# Patient Record
Sex: Female | Born: 1950 | Race: White | Hispanic: No | Marital: Married | Smoking: Current every day smoker
Health system: Southern US, Community
[De-identification: ages and names within clinical notes are randomized; demographics above are authoritative.]

## PROBLEM LIST (undated history)

## (undated) DIAGNOSIS — M25551 Pain in right hip: Secondary | ICD-10-CM

## (undated) DIAGNOSIS — E785 Hyperlipidemia, unspecified: Secondary | ICD-10-CM

## (undated) DIAGNOSIS — M8580 Other specified disorders of bone density and structure, unspecified site: Secondary | ICD-10-CM

## (undated) DIAGNOSIS — T8859XA Other complications of anesthesia, initial encounter: Secondary | ICD-10-CM

## (undated) DIAGNOSIS — M1990 Unspecified osteoarthritis, unspecified site: Secondary | ICD-10-CM

## (undated) DIAGNOSIS — F419 Anxiety disorder, unspecified: Secondary | ICD-10-CM

## (undated) DIAGNOSIS — Z973 Presence of spectacles and contact lenses: Secondary | ICD-10-CM

## (undated) DIAGNOSIS — R7303 Prediabetes: Secondary | ICD-10-CM

## (undated) DIAGNOSIS — G4730 Sleep apnea, unspecified: Secondary | ICD-10-CM

## (undated) DIAGNOSIS — K219 Gastro-esophageal reflux disease without esophagitis: Secondary | ICD-10-CM

## (undated) DIAGNOSIS — N189 Chronic kidney disease, unspecified: Secondary | ICD-10-CM

## (undated) HISTORY — PX: COLONOSCOPY: SHX174

## (undated) HISTORY — PX: HIP SURGERY: SHX245

## (undated) HISTORY — PX: LIPOSUCTION: SHX10

## (undated) HISTORY — PX: BREAST ENHANCEMENT SURGERY: SHX7

## (undated) HISTORY — PX: WISDOM TOOTH EXTRACTION: SHX21

## (undated) HISTORY — DX: Pain in right hip: M25.551

## (undated) HISTORY — PX: OTHER SURGICAL HISTORY: SHX169

## (undated) HISTORY — PX: TUBAL LIGATION: SHX77

## (undated) HISTORY — PX: SQUAMOUS CELL CARCINOMA EXCISION: SHX2433

## (undated) HISTORY — DX: Anxiety disorder, unspecified: F41.9

## (undated) HISTORY — DX: Unspecified osteoarthritis, unspecified site: M19.90

## (undated) HISTORY — DX: Other specified disorders of bone density and structure, unspecified site: M85.80

## (undated) HISTORY — DX: Sleep apnea, unspecified: G47.30

---

## 1999-01-16 ENCOUNTER — Other Ambulatory Visit: Admission: RE | Admit: 1999-01-16 | Discharge: 1999-01-16 | Payer: Self-pay | Admitting: *Deleted

## 2000-02-17 ENCOUNTER — Other Ambulatory Visit: Admission: RE | Admit: 2000-02-17 | Discharge: 2000-02-17 | Payer: Self-pay | Admitting: *Deleted

## 2001-02-23 ENCOUNTER — Other Ambulatory Visit: Admission: RE | Admit: 2001-02-23 | Discharge: 2001-02-23 | Payer: Self-pay | Admitting: *Deleted

## 2002-02-15 ENCOUNTER — Other Ambulatory Visit: Admission: RE | Admit: 2002-02-15 | Discharge: 2002-02-15 | Payer: Self-pay | Admitting: *Deleted

## 2003-02-19 ENCOUNTER — Other Ambulatory Visit: Admission: RE | Admit: 2003-02-19 | Discharge: 2003-02-19 | Payer: Self-pay | Admitting: *Deleted

## 2004-03-20 ENCOUNTER — Other Ambulatory Visit: Admission: RE | Admit: 2004-03-20 | Discharge: 2004-03-20 | Payer: Self-pay | Admitting: *Deleted

## 2005-10-14 ENCOUNTER — Other Ambulatory Visit: Admission: RE | Admit: 2005-10-14 | Discharge: 2005-10-14 | Payer: Self-pay | Admitting: Obstetrics and Gynecology

## 2006-03-30 ENCOUNTER — Ambulatory Visit: Payer: Self-pay | Admitting: Cardiology

## 2006-05-25 ENCOUNTER — Ambulatory Visit: Payer: Self-pay | Admitting: Cardiology

## 2006-09-03 ENCOUNTER — Encounter: Admission: RE | Admit: 2006-09-03 | Discharge: 2006-09-03 | Payer: Self-pay | Admitting: Orthopedic Surgery

## 2007-07-18 HISTORY — PX: OTHER SURGICAL HISTORY: SHX169

## 2008-03-15 ENCOUNTER — Ambulatory Visit: Payer: Self-pay | Admitting: Cardiology

## 2008-05-08 ENCOUNTER — Ambulatory Visit: Payer: Self-pay | Admitting: Cardiology

## 2008-05-08 LAB — CONVERTED CEMR LAB
ALT: 40 units/L — ABNORMAL HIGH (ref 0–35)
AST: 32 units/L (ref 0–37)
Bilirubin, Direct: 0.1 mg/dL (ref 0.0–0.3)
Cholesterol: 160 mg/dL (ref 0–200)
HDL: 29.6 mg/dL — ABNORMAL LOW (ref >39.0)
Total Protein: 6.3 g/dL (ref 6.0–8.3)
VLDL: 26 mg/dL (ref 0–40)

## 2008-07-30 ENCOUNTER — Ambulatory Visit: Payer: Self-pay | Admitting: Cardiology

## 2010-03-21 DIAGNOSIS — F17200 Nicotine dependence, unspecified, uncomplicated: Secondary | ICD-10-CM | POA: Insufficient documentation

## 2010-03-21 DIAGNOSIS — E669 Obesity, unspecified: Secondary | ICD-10-CM

## 2010-03-21 DIAGNOSIS — E785 Hyperlipidemia, unspecified: Secondary | ICD-10-CM | POA: Insufficient documentation

## 2010-04-08 ENCOUNTER — Ambulatory Visit: Payer: Self-pay | Admitting: Cardiology

## 2010-12-18 NOTE — Assessment & Plan Note (Signed)
Summary: f2y  Medications Added NASONEX 50 MCG/ACT SUSP (MOMETASONE FUROATE) one spray in each nostril SIMVASTATIN 40 MG TABS (SIMVASTATIN) 1 once daily FISH OIL 1000 MG CAPS (OMEGA-3 FATTY ACIDS) 1 two times a day CHANTIX STARTING MONTH PAK 0.5 MG X 11 & 1 MG X 42 TABS (VARENICLINE TARTRATE) AS DIRECTED PER PHARMACY SIMVASTATIN 40 MG TABS (SIMVASTATIN) 1 once daily        Visit Type:  Follow-up  CC:  no complaints.  History of Present Illness: Veronica Warren returns today for evaluation management of her mixed hyperlipidemia, tobacco use, and obesity.  She has been exercising on a regular basis in a boot camp. She denies any symptoms of angina or ischemia.  She does have some dependent edema at the end of the day despite taking HCTZ. She is not better with her diet including salt. She just got back from a cruise. She has gained weight.  She is also smoking a pack of cigarettes per day. She would like to quit.  She's also run out of her simvastatin. See compliance is a major issue.  Current Medications (verified): 1)  Hydrochlorothiazide 25 Mg Tabs (Hydrochlorothiazide) .Marland Kitchen.. 1 Tab Once Daily 2)  Aspirin 81 Mg Tbec (Aspirin) .... Take One Tablet By Mouth Daily 3)  Nasonex 50 Mcg/act Susp (Mometasone Furoate) .... One Spray in Each Nostril  Allergies: 1)  ! Valium 2)  ! Codeine  Past History:  Past Medical History: Last updated: 03/21/2010 HYPERLIPIDEMIA-MIXED (ICD-272.4) TOBACCO USER (ICD-305.1) OBESITY (ICD-278.00)    Past Surgical History: Last updated: 03/21/2010 Hip Arthroplasty-Total C-sections Lipo suction  Tummy tuck  Social History: Last updated: 03/21/2010 Married  Tobacco Use - Yes.  Alcohol Use - yes Regular Exercise - yes Drug Use - no  Risk Factors: Exercise: yes (03/21/2010)  Risk Factors: Smoking Status: current (03/21/2010)  Review of Systems       negative other than history of present illness  Vital Signs:  Patient profile:   60  year old female Height:      63 inches Weight:      182 pounds BMI:     32.36 Pulse rate:   80 / minute Pulse rhythm:   regular BP sitting:   122 / 82  (left arm)  Vitals Entered By: Jacquelin Hawking, CMA (Apr 08, 2010 10:11 AM)  Physical Exam  General:  obese.  obese.   Head:  normocephalic and atraumatic Eyes:  PERRLA/EOM intact; conjunctiva and lids normal. Mouth:  Teeth, gums and palate normal. Oral mucosa normal. Neck:  Neck supple, no JVD. No masses, thyromegaly or abnormal cervical nodes. Chest Tyshana Nishida:  no deformities or breast masses noted Lungs:  Clear bilaterally to auscultation and percussion. Heart:  Non-displaced PMI, chest non-tender; regular rate and rhythm, S1, S2 without murmurs, rubs or gallops. Carotid upstroke normal, no bruit. Normal abdominal aortic size, no bruits. Femorals normal pulses, no bruits. Pedals normal pulses. No edema, no varicosities. Msk:  Back normal, normal gait. Muscle strength and tone normal. Pulses:  pulses normal in all 4 extremities Extremities:  No clubbing or cyanosis. Neurologic:  Alert and oriented x 3. Skin:  Intact without lesions or rashes. Psych:  Normal affect.   Impression & Recommendations:  Problem # 1:  TOBACCO USER (ICD-305.1) Assessment Deteriorated She says she has smoked about a pack a day for while. She would like a prescription for Chantix. We have done that in the form of a starter pack going over indications and side effects.  Problem #  2:  HYPERLIPIDEMIA-MIXED (ICD-272.4)  She has run out of her simvastatin. We will restart simvastatin 40 mg every night and facial 1 g twice a day. Check followup blood work in 6-8 weeks. The following medications were removed from the medication list:    Simvastatin 40 Mg Tabs (Simvastatin) .Marland Kitchen... 1 tab at bedtime Her updated medication list for this problem includes:    Simvastatin 40 Mg Tabs (Simvastatin) .Marland Kitchen... 1 once daily  Orders: EKG w/ Interpretation (93000)  Problem # 3:   OBESITY (ICD-278.00) Assessment: Deteriorated  She is exercising 3 hours plus a week. Have encouraged her to continue to do so but she would not lose weight despite exercising. She will probably gained more weight when she stopped smoking. She is aware of this.  Orders: EKG w/ Interpretation (93000)  Patient Instructions: 1)  Your physician recommends that you schedule a follow-up appointment in: 6 MONTHS WITH DR Tremont Gavitt DUE NOV 2011 2)  Your physician recommends that you return for lab work UJ:[id number] LABS LIPID LIVER 272.4 V58.69 WEEK OF JULY 5 TH 3)  Your physician has recommended you make the following change in your medication: CHANTIX AS DIRECTED  4)  SIMVASTATIN 40 MG 1 once daily 5)  FISH OIL 1000 MG I two times a day  Prescriptions: SIMVASTATIN 40 MG TABS (SIMVASTATIN) 1 once daily  #90 x 3   Entered by:   Scherrie Bateman, LPN   Authorized by:   Gaylord Shih, MD, [facility]   Signed by:   Scherrie Bateman, LPN on 82/95/6213   Method used:   Print then Give to Patient   RxID:   [id number] CHANTIX STARTING MONTH PAK 0.5 MG X 11 & 1 MG X 42 TABS (VARENICLINE TARTRATE) AS DIRECTED PER PHARMACY  #60 x 3   Entered by:   Scherrie Bateman, LPN   Authorized by:   Gaylord Shih, MD, [facility]   Signed by:   Scherrie Bateman, LPN on 13/24/4010   Method used:   Print then Give to Patient   RxID:   [id number] FISH OIL 1000 MG CAPS (OMEGA-3 FATTY ACIDS) 1 two times a day  #60 x 0   Entered by:   Scherrie Bateman, LPN   Authorized by:   Gaylord Shih, MD, [facility]   Signed by:   Scherrie Bateman, LPN on 95/63/8756   Method used:   Electronically to        [facility] [phone]* (retail)       [address]       [place], [place]  [address]       Ph: [phone]       Fax: [phone]   RxID:   [id number] SIMVASTATIN 40 MG TABS (SIMVASTATIN) 1 once daily  #30 x 0   Entered by:   Scherrie Bateman, LPN   Authorized by:   Gaylord Shih, MD, [facility]   Signed by:   Scherrie Bateman, LPN on 83/15/1761   Method used:   Electronically to        [facility] [phone]* (retail)       [address]       [place], [place]  [address]       Ph: [phone]       Fax: [phone]   RxID:   [id number]

## 2011-03-31 NOTE — Assessment & Plan Note (Signed)
[facility]                            CARDIOLOGY OFFICE NOTE   NAME:Veronica Warren                    MRN:          [id number]  DATE:07/30/2008                            DOB:          1951-02-02    [name] returns today for management of her mixed hyperlipidemia,  sedentary lifestyle, obesity, and tobacco use.   She has lost couple of pounds.  She admits that she does not walk.  She  is under a lot of stress with her mother and father's health.  She  spends every other week down in [place], [place] to help them.   She is taking simvastatin 40 mg nightly in replacement for Vytorin.   Her numbers in June showed a total cholesterol of 160, triglycerides of  129, HDL of 29.6, and LDL of 105.  Her HDL did drop from 45 to this  value of 29.6.  I have asked her to add fish oil.  She is plagued and  bothered by the size of the capsules.  We will try to cut the dose.   She has had no chest pain or angina.   CURRENT MEDICATIONS:  1. Estratest one daily.  2. Wellbutrin 300 mg a day.  3. Prometrium 200 mg a day.  4. Simvastatin 40 mg a day.  5. Hydrochlorothiazide 25 mg a day.  6. Enteric-coated aspirin 81 mg a day.  7. Claritin 10 mg a day.   PHYSICAL EXAMINATION:  VITAL SIGNS:  Her blood pressure today is 114/74.  Pulse is 79 and regular.  Weight is 182.  HEENT:  Normal.  NECK:  Carotids are full without bruits.  Thyroid is not enlarged.  Trachea is midline.  LUNGS:  Clear to auscultation and percussion.  HEART:  Nondisplaced PMI, soft S1 and S2.  ABDOMEN:  Soft.  Good bowel sounds.  EXTREMITIES:  No cyanosis, clubbing, or edema.  Pulses are intact.  NEUROLOGIC:  Intact.   Ms. Hapke is unfortunately not taking the time to invest in her health.  We discussed at least walking 3 hours per week.  She says she will try  to do better.  She is taking her medications.  Her blood pressure is  under good control and her lipids are  satisfactory on simvastatin except  for the decrease in her HDL.   PLAN:  1. Continue current medication.  2. Walking 3 hours per week.  3. Maintain weight or try to lose.  4. Add fish oil 1000 mg daily.  5. Check labs in June 2010 and see me at that time.     Thomas C. Daleen Squibb, MD, [facility]  Electronically Signed    TCW/MedQ  DD: 07/30/2008  DT: 07/31/2008  Job #: [id number]   cc:   Guy Sandifer. Henderson Cloud, M.D.

## 2011-03-31 NOTE — Assessment & Plan Note (Signed)
[facility]                            CARDIOLOGY OFFICE NOTE   NAME:Janowiak, NEDA WILLENBRING                    MRN:          [id number]  DATE:03/15/2008                            DOB:          03-04-1951    Ms. Yum returns today for further management of her mixed  hyperlipidemia, tobacco use, sedentary lifestyle, obesity, and moderate  risk of premature coronary disease.   I last saw her Mar 30, 2006.   Since that time she has had a right hip replacement.  Somewhere in  there, she stopped her Vytorin and did not start it back.  She was even  thinking the Vytorin may have been responsible for hip problem.  I have  reassured her today that this was not the case.   We had her numbers well-controlled on Vytorin.   She is taking hydrochlorothiazide 25 mg every other day for some  peripheral edema.  Estratest one daily, Wellbutrin 300 mg a day,  Prometrium 2 mg a day.   She denies orthopnea, PND, chest discomfort, palpitations, presyncope,  syncope.   EXAM:  Blood pressure is 112/76, her pulse 78 and regular.  Weight is  184, which is up 11 pounds.  HEENT:  Normocephalic, atraumatic.  PERRL.  Extraocular is intact.  Sclerae are clear.  Face symmetry is normal.  Carotids are equal bilateral bruits, no JVD.  Thyroid is not enlarged.  Trachea is midline.  LUNGS:  Clear.  HEART:  Reveals a poorly appreciated PMI.  Soft S1-S2 without murmur or  gallop.  ABDOMEN:  Soft, good bowel sounds.  There is no cyanosis, clubbing or edema.  Pulses are intact.  NEURO:  Exam is intact.   Electrocardiogram is normal.   I had a long talk with Mrs. Gallacher today.  I have again told her she is  at moderate risk of having a premature coronary event and she has picked  up an additional risk factor of age since I last saw her.   RECOMMENDATIONS:  1. Simvastatin 40 mg p.o. nightly.  This is generic and will not cost      but $10 a month and she is willing to invest in  this.  2. Follow up labs in 6 weeks.  3. See me back in about 3 months for accountability.  4. Increase walking or exercising 3 hours a week.  5. Discontinue smoking.  6. Weight loss.     Thomas C. Daleen Squibb, MD, [facility]  Electronically Signed    TCW/MedQ  DD: 03/15/2008  DT: 03/15/2008  Job #: [id number]   cc:   Guy Sandifer. Henderson Cloud, M.D.

## 2012-08-01 ENCOUNTER — Ambulatory Visit (AMBULATORY_SURGERY_CENTER): Payer: PRIVATE HEALTH INSURANCE

## 2012-08-01 VITALS — Ht 63.0 in | Wt 194.8 lb

## 2012-08-01 DIAGNOSIS — Z1211 Encounter for screening for malignant neoplasm of colon: Secondary | ICD-10-CM

## 2012-08-01 DIAGNOSIS — Z860100 Personal history of colon polyps, unspecified: Secondary | ICD-10-CM

## 2012-08-01 MED ORDER — MOVIPREP 100 G PO SOLR: ORAL | Status: DC

## 2012-08-01 NOTE — Progress Notes (Signed)
Pt came into office for her pre-visit prior to her colonoscopy with Dr Coral Spikes on 08/10/12.She states she had a colonoscopy done 5 years ago with Dr Kinnie Scales. A medical release form was filled out and given to Sherri (CMA).

## 2012-08-02 ENCOUNTER — Encounter: Payer: Self-pay | Admitting: Gastroenterology

## 2012-08-10 ENCOUNTER — Ambulatory Visit (AMBULATORY_SURGERY_CENTER): Payer: PRIVATE HEALTH INSURANCE | Admitting: Gastroenterology

## 2012-08-10 ENCOUNTER — Encounter: Payer: Self-pay | Admitting: Gastroenterology

## 2012-08-10 VITALS — BP 119/66 | HR 80 | Temp 98.2°F | Resp 18 | Ht 63.0 in | Wt 194.0 lb

## 2012-08-10 DIAGNOSIS — Z860100 Personal history of colon polyps, unspecified: Secondary | ICD-10-CM

## 2012-08-10 DIAGNOSIS — D126 Benign neoplasm of colon, unspecified: Secondary | ICD-10-CM

## 2012-08-10 DIAGNOSIS — Z1211 Encounter for screening for malignant neoplasm of colon: Secondary | ICD-10-CM

## 2012-08-10 MED ORDER — SODIUM CHLORIDE 0.9 % IV SOLN: 500.0000 mL | INTRAVENOUS | Status: DC

## 2012-08-10 NOTE — Patient Instructions (Addendum)
Discharge instructions given with verbal understanding. Handout on polyp given. Resume previous medications. YOU HAD AN ENDOSCOPIC PROCEDURE TODAY AT THE [facility]: Refer to the procedure report that was given to you for any specific questions about what was found during the examination.  If the procedure report does not answer your questions, please call your gastroenterologist to clarify.  If you requested that your care partner not be given the details of your procedure findings, then the procedure report has been included in a sealed envelope for you to review at your convenience later.  YOU SHOULD EXPECT: Some feelings of bloating in the abdomen. Passage of more gas than usual.  Walking can help get rid of the air that was put into your GI tract during the procedure and reduce the bloating. If you had a lower endoscopy (such as a colonoscopy or flexible sigmoidoscopy) you may notice spotting of blood in your stool or on the toilet paper. If you underwent a bowel prep for your procedure, then you may not have a normal bowel movement for a few days.  DIET: Your first meal following the procedure should be a light meal and then it is ok to progress to your normal diet.  A half-sandwich or bowl of soup is an example of a good first meal.  Heavy or fried foods are harder to digest and may make you feel nauseous or bloated.  Likewise meals heavy in dairy and vegetables can cause extra gas to form and this can also increase the bloating.  Drink plenty of fluids but you should avoid alcoholic beverages for 24 hours.  ACTIVITY: Your care partner should take you home directly after the procedure.  You should plan to take it easy, moving slowly for the rest of the day.  You can resume normal activity the day after the procedure however you should NOT DRIVE or use heavy machinery for 24 hours (because of the sedation medicines used during the test).    SYMPTOMS TO REPORT IMMEDIATELY: A  gastroenterologist can be reached at any hour.  During normal business hours, 8:30 AM to 5:00 PM Monday through Friday, call [phone].  After hours and on weekends, please call the GI answering service at [phone] who will take a message and have the physician on call contact you.   Following lower endoscopy (colonoscopy or flexible sigmoidoscopy):  Excessive amounts of blood in the stool  Significant tenderness or worsening of abdominal pains  Swelling of the abdomen that is new, acute  Fever of 100F or higher   FOLLOW UP: If any biopsies were taken you will be contacted by phone or by letter within the next 1-3 weeks.  Call your gastroenterologist if you have not heard about the biopsies in 3 weeks.  Our staff will call the home number listed on your records the next business day following your procedure to check on you and address any questions or concerns that you may have at that time regarding the information given to you following your procedure. This is a courtesy call and so if there is no answer at the home number and we have not heard from you through the emergency physician on call, we will assume that you have returned to your regular daily activities without incident.  SIGNATURES/CONFIDENTIALITY: You and/or your care partner have signed paperwork which will be entered into your electronic medical record.  These signatures attest to the fact that that the information above on your After Visit Summary   has been reviewed and is understood.  Full responsibility of the confidentiality of this discharge information lies with you and/or your care-partner.  

## 2012-08-10 NOTE — Op Note (Signed)
[facility] [address] [place], [address]   COLONOSCOPY PROCEDURE REPORT  PATIENT: Veronica Warren, Veronica Warren  MR#: [id number] BIRTHDATE: 09-09-1951 , 60  yrs. old GENDER: Female ENDOSCOPIST: Mardella Layman, MD, [facility] REFERRED BY:  Harold Hedge, M.D. PROCEDURE DATE:  08/10/2012 PROCEDURE:   Colonoscopy with biopsy ASA CLASS:   Class II INDICATIONS:hx.  of polyps. MEDICATIONS: Propofol (Diprivan)  DESCRIPTION OF PROCEDURE:   After the risks and benefits and of the procedure were explained, informed consent was obtained.  A digital rectal exam revealed no abnormalities of the rectum.    The LB [id number]  endoscope was introduced through the anus and advanced to the cecum, which was identified by both the appendix and ileocecal valve .  The quality of the prep was excellent, using MoviPrep .  The instrument was then slowly withdrawn as the colon was fully examined.     COLON FINDINGS: A normal appearing cecum, ileocecal valve, and appendiceal orifice were identified.  The ascending, hepatic flexure, transverse, splenic flexure, descending, sigmoid colon and rectum appeared unremarkable.  No polyps or cancers were seen.   A diminutive polyp was found in the sigmoid colon..  A biopsy was performed using a cold snare.     Retroflexed views revealed no abnormalities.     The scope was then withdrawn from the patient and the procedure completed.  COMPLICATIONS: There were no complications. ENDOSCOPIC IMPRESSION: 1.   Normal colon 2.   Diminutive polyp was found; biopsy was performed using a cold snare  RECOMMENDATIONS: Repeat colonoscopy in 5 years if polyp adenomatous; otherwise 10 years   REPEAT EXAM:  cc:  _______________________________ eSignedMardella Layman, MD, [facility] 08/10/2012 12:02 PM

## 2012-08-10 NOTE — Progress Notes (Signed)
Patient did not experience any of the following events: a burn prior to discharge; a fall within the facility; wrong site/side/patient/procedure/implant event; or a hospital transfer or hospital admission upon discharge from the facility. (G8907) Patient did not have preoperative order for IV antibiotic SSI prophylaxis. (G8918)  

## 2012-08-11 ENCOUNTER — Telehealth: Payer: Self-pay | Admitting: *Deleted

## 2012-08-11 NOTE — Telephone Encounter (Signed)
  Follow up Call-  Call back number 08/10/2012  Post procedure Call Back phone  # [phone]  Permission to leave phone message Yes     Patient questions:  Do you have a fever, pain , or abdominal swelling? no Pain Score  0 *  Have you tolerated food without any problems? yes  Have you been able to return to your normal activities? yes  Do you have any questions about your discharge instructions: Diet   no Medications  no Follow up visit  no  Do you have questions or concerns about your Care? no  Actions: * If pain score is 4 or above: No action needed, pain <4.

## 2012-08-22 ENCOUNTER — Encounter: Payer: Self-pay | Admitting: Internal Medicine

## 2014-04-03 ENCOUNTER — Encounter (HOSPITAL_COMMUNITY): Payer: Self-pay | Admitting: Pharmacy Technician

## 2014-04-04 ENCOUNTER — Other Ambulatory Visit (HOSPITAL_COMMUNITY): Payer: Self-pay | Admitting: *Deleted

## 2014-04-04 NOTE — Patient Instructions (Addendum)
Veronica Warren  04/04/2014                           YOUR PROCEDURE IS SCHEDULED ON:  04/16/14 AT 10:50 AM               ENTER [facility] ENTRANCE AND                            FOLLOW  SIGNS TO SHORT STAY CENTER                 ARRIVE AT SHORT STAY AT: 7:50 AM               CALL THIS NUMBER IF ANY PROBLEMS THE DAY OF SURGERY :               [phone]                                REMEMBER:   Do not eat food or drink liquids AFTER MIDNIGHT                  Take these medicines the morning of surgery with               A SIPS OF WATER :      TRAMADOL IF NEEDED FOR PAIN   Do not wear jewelry, make-up   Do not wear lotions, powders, or perfumes.   Do not shave legs or underarms 12 hrs. before surgery (men may shave face)  Do not bring valuables to the hospital.  Contacts, dentures or bridgework may not be worn into surgery.  Leave suitcase in the car. After surgery it may be brought to your room.  For patients admitted to the hospital more than one night, checkout time is            11:00 AM                                                      ________________________________________________________________________                                                                        [place]  Before surgery, you can play an important role.  Because skin is not sterile, your skin needs to be as free of germs as possible.  You can reduce the number of germs on your skin by washing with CHG (chlorahexidine gluconate) soap before surgery.  CHG is an antiseptic cleaner which kills germs and bonds with the skin to continue killing germs even after washing. Please DO NOT use if you have an allergy to CHG or antibacterial soaps.  If your skin becomes reddened/irritated stop using the CHG and inform your nurse when you arrive at Short Stay. Do not shave (including legs and underarms) for at least 48 hours prior to the first  CHG  shower.  You may shave your face. Please follow these instructions carefully:   1.  Shower with CHG Soap the night before surgery and the  morning of Surgery.   2.  If you choose to wash your hair, wash your hair first as usual with your  normal  Shampoo.   3.  After you shampoo, rinse your hair and body thoroughly to remove the  shampoo.                                         4.  Use CHG as you would any other liquid soap.  You can apply chg directly  to the skin and wash . Gently wash with scrungie or clean wascloth    5.  Apply the CHG Soap to your body ONLY FROM THE NECK DOWN.   Do not use on open                           Wound or open sores. Avoid contact with eyes, ears mouth and genitals (private parts).                        Genitals (private parts) with your normal soap.              6.  Wash thoroughly, paying special attention to the area where your surgery  will be performed.   7.  Thoroughly rinse your body with warm water from the neck down.   8.  DO NOT shower/wash with your normal soap after using and rinsing off  the CHG Soap .                9.  Pat yourself dry with a clean towel.             10.  Wear clean pajamas.             11.  Place clean sheets on your bed the night of your first shower and do not  sleep with pets.  Day of Surgery : Do not apply any lotions/deodorants the morning of surgery.  Please wear clean clothes to the hospital/surgery center.  FAILURE TO FOLLOW THESE INSTRUCTIONS MAY RESULT IN THE CANCELLATION OF YOUR SURGERY    PATIENT SIGNATURE_________________________________     Incentive Spirometer  An incentive spirometer is a tool that can help keep your lungs clear and active. This tool measures how well you are filling your lungs with each breath. Taking long deep breaths may help reverse or decrease the chance of developing breathing (pulmonary) problems (especially infection) following:  A long period of time when you are  unable to move or be active. BEFORE THE PROCEDURE   If the spirometer includes an indicator to show your best effort, your nurse or respiratory therapist will set it to a desired goal.  If possible, sit up straight or lean slightly forward. Try not to slouch.  Hold the incentive spirometer in an upright position. INSTRUCTIONS FOR USE  1. Sit on the edge of your bed if possible, or sit up as far as you can in bed or on a chair. 2. Hold the incentive spirometer in an upright position. 3. Breathe out normally. 4. Place the mouthpiece in your mouth and seal your lips tightly around it. 5. Breathe in slowly and as  deeply as possible, raising the piston or the ball toward the top of the column. 6. Hold your breath for 3-5 seconds or for as long as possible. Allow the piston or ball to fall to the bottom of the column. 7. Remove the mouthpiece from your mouth and breathe out normally. 8. Rest for a few seconds and repeat Steps 1 through 7 at least 10 times every 1-2 hours when you are awake. Take your time and take a few normal breaths between deep breaths. 9. The spirometer may include an indicator to show your best effort. Use the indicator as a goal to work toward during each repetition. 10. After each set of 10 deep breaths, practice coughing to be sure your lungs are clear. If you have an incision (the cut made at the time of surgery), support your incision when coughing by placing a pillow or rolled up towels firmly against it. Once you are able to get out of bed, walk around indoors and cough well. You may stop using the incentive spirometer when instructed by your caregiver.  RISKS AND COMPLICATIONS  Take your time so you do not get dizzy or light-headed.  If you are in pain, you may need to take or ask for pain medication before doing incentive spirometry. It is harder to take a deep breath if you are having pain. AFTER USE  Rest and breathe slowly and easily.  It can be helpful to  keep track of a log of your progress. Your caregiver can provide you with a simple table to help with this. If you are using the spirometer at home, follow these instructions: [place] IF:   You are having difficultly using the spirometer.  You have trouble using the spirometer as often as instructed.  Your pain medication is not giving enough relief while using the spirometer.  You develop fever of 100.5 F (38.1 C) or higher. SEEK IMMEDIATE MEDICAL CARE IF:   You cough up bloody sputum that had not been present before.  You develop fever of 102 F (38.9 C) or greater.  You develop worsening pain at or near the incision site. MAKE SURE YOU:   Understand these instructions.  Will watch your condition.  Will get help right away if you are not doing well or get worse. Document Released: 03/15/2007 Document Revised: 01/25/2012 Document Reviewed: 05/16/2007 Exit[employer] Patient Information 2014 ExitCare, [place].   ________________________________________________________________________  WHAT IS A BLOOD TRANSFUSION? Blood Transfusion Information  A transfusion is the replacement of blood or some of its parts. Blood is made up of multiple cells which provide different functions.  Red blood cells carry oxygen and are used for blood loss replacement.  White blood cells fight against infection.  Platelets control bleeding.  Plasma helps clot blood.  Other blood products are available for specialized needs, such as hemophilia or other clotting disorders. BEFORE THE TRANSFUSION  Who gives blood for transfusions?   Healthy volunteers who are fully evaluated to make sure their blood is safe. This is blood bank blood. Transfusion therapy is the safest it has ever been in the practice of medicine. Before blood is taken from a donor, a complete history is taken to make sure that person has no history of diseases nor engages in risky social behavior (examples are intravenous drug  use or sexual activity with multiple partners). The donor's travel history is screened to minimize risk of transmitting infections, such as malaria. The donated blood is tested for signs of infectious  diseases, such as HIV and hepatitis. The blood is then tested to be sure it is compatible with you in order to minimize the chance of a transfusion reaction. If you or a relative donates blood, this is often done in anticipation of surgery and is not appropriate for emergency situations. It takes many days to process the donated blood. RISKS AND COMPLICATIONS Although transfusion therapy is very safe and saves many lives, the main dangers of transfusion include:   Getting an infectious disease.  Developing a transfusion reaction. This is an allergic reaction to something in the blood you were given. Every precaution is taken to prevent this. The decision to have a blood transfusion has been considered carefully by your caregiver before blood is given. Blood is not given unless the benefits outweigh the risks. AFTER THE TRANSFUSION  Right after receiving a blood transfusion, you will usually feel much better and more energetic. This is especially true if your red blood cells have gotten low (anemic). The transfusion raises the level of the red blood cells which carry oxygen, and this usually causes an energy increase.  The nurse administering the transfusion will monitor you carefully for complications. HOME CARE INSTRUCTIONS  No special instructions are needed after a transfusion. You may find your energy is better. Speak with your caregiver about any limitations on activity for underlying diseases you may have. SEEK MEDICAL CARE IF:   Your condition is not improving after your transfusion.  You develop redness or irritation at the intravenous (IV) site. SEEK IMMEDIATE MEDICAL CARE IF:  Any of the following symptoms occur over the next 12 hours:  Shaking chills.  You have a temperature by mouth  above 102 F (38.9 C), not controlled by medicine.  Chest, back, or muscle pain.  People around you feel you are not acting correctly or are confused.  Shortness of breath or difficulty breathing.  Dizziness and fainting.  You get a rash or develop hives.  You have a decrease in urine output.  Your urine turns a dark color or changes to pink, red, or brown. Any of the following symptoms occur over the next 10 days:  You have a temperature by mouth above 102 F (38.9 C), not controlled by medicine.  Shortness of breath.  Weakness after normal activity.  The white part of the eye turns yellow (jaundice).  You have a decrease in the amount of urine or are urinating less often.  Your urine turns a dark color or changes to pink, red, or brown. Document Released: 10/30/2000 Document Revised: 01/25/2012 Document Reviewed: 06/18/2008 [facility] Patient Information 2014 [place], [place].  _______________________________________________________________________

## 2014-04-06 ENCOUNTER — Encounter (HOSPITAL_COMMUNITY): Payer: Self-pay

## 2014-04-06 ENCOUNTER — Encounter (HOSPITAL_COMMUNITY)
Admission: RE | Admit: 2014-04-06 | Discharge: 2014-04-06 | Disposition: A | Discharge status: Home / Self Care | Payer: PRIVATE HEALTH INSURANCE | Source: Ambulatory Visit | Attending: Orthopedic Surgery | Admitting: Orthopedic Surgery

## 2014-04-06 DIAGNOSIS — Z01812 Encounter for preprocedural laboratory examination: Secondary | ICD-10-CM | POA: Insufficient documentation

## 2014-04-06 HISTORY — DX: Hyperlipidemia, unspecified: E78.5

## 2014-04-06 HISTORY — DX: Chronic kidney disease, unspecified: N18.9

## 2014-04-06 LAB — URINALYSIS, ROUTINE W REFLEX MICROSCOPIC
BILIRUBIN URINE: NEGATIVE
Glucose, UA: NEGATIVE mg/dL
HGB URINE DIPSTICK: NEGATIVE
Ketones, ur: NEGATIVE mg/dL
Leukocytes, UA: NEGATIVE
NITRITE: NEGATIVE
PROTEIN: NEGATIVE mg/dL
SPECIFIC GRAVITY, URINE: 1.019 (ref 1.005–1.030)
UROBILINOGEN UA: 0.2 mg/dL (ref 0.0–1.0)
pH: 6 (ref 5.0–8.0)

## 2014-04-06 LAB — APTT: aPTT: 25 seconds (ref 24–37)

## 2014-04-06 LAB — CBC
HCT: 43.8 % (ref 36.0–46.0)
HEMOGLOBIN: 14.7 g/dL (ref 12.0–15.0)
MCH: 28.7 pg (ref 26.0–34.0)
MCHC: 33.6 g/dL (ref 30.0–36.0)
MCV: 85.4 fL (ref 78.0–100.0)
Platelets: 264 10*3/uL (ref 150–400)
RBC: 5.13 MIL/uL — ABNORMAL HIGH (ref 3.87–5.11)
RDW: 13.8 % (ref 11.5–15.5)
WBC: 13.4 10*3/uL — ABNORMAL HIGH (ref 4.0–10.5)

## 2014-04-06 LAB — SURGICAL PCR SCREEN
MRSA, PCR: NEGATIVE
STAPHYLOCOCCUS AUREUS: POSITIVE — AB

## 2014-04-06 LAB — BASIC METABOLIC PANEL
BUN: 24 mg/dL — AB (ref 6–23)
CO2: 24 mEq/L (ref 19–32)
Calcium: 9.2 mg/dL (ref 8.4–10.5)
Chloride: 102 mEq/L (ref 96–112)
Creatinine, Ser: 1.11 mg/dL — ABNORMAL HIGH (ref 0.50–1.10)
GFR calc Af Amer: 61 mL/min — ABNORMAL LOW (ref >90)
GFR calc non Af Amer: 52 mL/min — ABNORMAL LOW (ref >90)
GLUCOSE: 82 mg/dL (ref 70–99)
POTASSIUM: 4.2 meq/L (ref 3.7–5.3)
Sodium: 139 mEq/L (ref 137–147)

## 2014-04-06 LAB — PROTIME-INR
INR: 0.89 (ref 0.00–1.49)
PROTHROMBIN TIME: 11.9 s (ref 11.6–15.2)

## 2014-04-11 NOTE — H&P (Signed)
TOTAL HIP REVISION ADMISSION H&P  Patient is admitted for right revision total hip arthroplasty, converting it to a ceramic on polyethylene insert.  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 female, presented for second opinion evaluation of her right hip. She had history of right total hip replacement performed in 2008 with a Pinnacle metal on metal hip per Dr. Ronny Flurry at [facility] in [place]. She has had some progressive and persistent discomfort in the right hip over the past couple years and has had her hip worked up extensively for metal ion issue as well as hip aspiration to rule out infection. She is here to discuss future surgical intervention. She had been seen at [facility] with a plan for her to have revision hip surgery. She presents today with second opinion information as well as labwork.   She walks with only a mild limp. She does not have an assist device. She tolerates hip range of motion well with some persistent discomfort with some pain with hip flexion and internal rotation. She has no problems with her left hip currently. She is otherwise neurovascularly intact. No signs of any cellulitic changes.  review her labs including a sed rate of 9 and a C-reactive protein that was elevated at 15 slightly above the range at 0-6. Her cobalt level was noted to be 33. Chromium level was just about 7. Based on this asymmetry she most likely has had some corrosion and debris that has resulted in inflammation in the hip joint. Her work up has apparently been negative for infection with hip aspiration per her reports.  Recommend she consider having it done as she had already heard that it would eliminate the concern she has about metal ion issues inside the hip. She will need to have her acetabular component removed and revised to better orient it and then convert it over to a ceramic on polyethylene insert  based on her age and activity level.  Risks, benefits and expectations were discussed with the patient.  Risks including but not limited to the risk of anesthesia, blood clots, nerve damage, blood vessel damage, failure of the prosthesis, infection and up to and including death.  Patient understand the risks, benefits and expectations and wishes to proceed with surgery.   PCP: No primary provider on file.  D/C Plans:      SNF  Post-op Meds:       No Rx given  Tranexamic Acid:      is to be given  Decadron:      is to be given  FYI:     ASA post-op  Oxycodone post-op    Patient Active Problem List   Diagnosis Date Noted  . HYPERLIPIDEMIA-MIXED 03/21/2010  . OBESITY 03/21/2010  . TOBACCO USER 03/21/2010   Past Medical History  Diagnosis Date  . Hip pain, right   . Hyperlipidemia   . Arthritis     RHEUMATOID AND OSTEO  ARTHRITIS  . Chronic kidney disease     STAGE III KIDNEY DISEASE    Past Surgical History  Procedure Laterality Date  . Hip surgery      right surg/07/2007  . Colonoscopy    . Breast enhancement surgery      bil  . Tummy tuck    . Cesarean section      2 times    No prescriptions prior to admission   Allergies  Allergen Reactions  .  Vicodin [Hydrocodone-Acetaminophen] Anaphylaxis  . Diazepam Other (See Comments)    Pt wake up fighting     History  Substance Use Topics  . Smoking status: Current Every Day Smoker -- 0.10 packs/day    Types: Cigarettes  . Smokeless tobacco: Never Used  . Alcohol Use: 3.0 oz/week    5 Cans of beer per week     Comment: OCCASIONAL    Family History  Problem Relation Age of Onset  . Diabetes Father   . Colon cancer Neg Hx   . Colon polyps Neg Hx   . Rectal cancer Neg Hx   . Stomach cancer Neg Hx       Review of Systems  Constitutional: Negative.   HENT: Negative.   Eyes: Negative.   Respiratory: Negative.   Cardiovascular: Negative.   Gastrointestinal: Negative.   Genitourinary: Negative.    Musculoskeletal: Positive for joint pain.  Skin: Negative.   Neurological: Negative.   Endo/Heme/Allergies: Negative.   Psychiatric/Behavioral: Negative.     Objective:  Physical Exam  Constitutional: She is oriented to person, place, and time. She appears well-developed and well-nourished.  HENT:  Head: Normocephalic and atraumatic.  Mouth/Throat: Oropharynx is clear and moist.  Eyes: Pupils are equal, round, and reactive to light.  Neck: Neck supple. No JVD present. No tracheal deviation present. No thyromegaly present.  Cardiovascular: Normal rate, regular rhythm, normal heart sounds and intact distal pulses.   Respiratory: Effort normal and breath sounds normal. No stridor. No respiratory distress. She has no wheezes.  GI: Soft. There is no tenderness. There is no guarding.  Musculoskeletal:       Right hip: She exhibits decreased range of motion, decreased strength, tenderness and bony tenderness. She exhibits no swelling, no deformity and no laceration.  Lymphadenopathy:    She has no cervical adenopathy.  Neurological: She is alert and oriented to person, place, and time.  Skin: Skin is warm and dry.  Psychiatric: She has a normal mood and affect.      Labs:  Estimated body mass index is 34.37 kg/(m^2) as calculated from the following:   Height as of 08/10/12: 5\' 3"  (1.6 m).   Weight as of 08/10/12: 87.998 kg (194 lb).  Imaging Review:  Plain radiographs with a DePuy Pinnacle metal on metal implant of the right hip(s). The bone quality appears to be good for age and reported activity level.   Assessment/Plan:  Right hip with failed previous arthroplasty.  The patient history, physical examination, clinical judgement of the provider and imaging studies are consistent with failed previous arthroplasty of the right hip(s), previous total hip arthroplasty. Revision total hip arthroplasty is deemed medically necessary. The treatment options including medical management,  injection therapy, arthroscopy and arthroplasty were discussed at length. The risks and benefits of total hip arthroplasty were presented and reviewed. The risks due to aseptic loosening, infection, stiffness, dislocation/subluxation,  thromboembolic complications and other imponderables were discussed.  The patient acknowledged the explanation, agreed to proceed with the plan and consent was signed. Patient is being admitted for inpatient treatment for surgery, pain control, PT, OT, prophylactic antibiotics, VTE prophylaxis, progressive ambulation and ADL's and discharge planning. The patient is planning to be discharged to skilled nursing facility.    West Pugh Kyshon Tolliver   PA-C  04/11/2014, 12:31 PM

## 2014-04-16 ENCOUNTER — Encounter (HOSPITAL_COMMUNITY)
Admission: RE | Disposition: A | Discharge status: Skilled Nursing Facility | Payer: Self-pay | Source: Ambulatory Visit | Attending: Orthopedic Surgery

## 2014-04-16 ENCOUNTER — Inpatient Hospital Stay (HOSPITAL_COMMUNITY): Payer: PRIVATE HEALTH INSURANCE

## 2014-04-16 ENCOUNTER — Encounter (HOSPITAL_COMMUNITY): Payer: PRIVATE HEALTH INSURANCE | Admitting: Anesthesiology

## 2014-04-16 ENCOUNTER — Encounter (HOSPITAL_COMMUNITY): Payer: Self-pay | Admitting: *Deleted

## 2014-04-16 ENCOUNTER — Inpatient Hospital Stay (HOSPITAL_COMMUNITY): Payer: PRIVATE HEALTH INSURANCE | Admitting: Anesthesiology

## 2014-04-16 ENCOUNTER — Inpatient Hospital Stay (HOSPITAL_COMMUNITY)
Admission: RE | Admit: 2014-04-16 | Discharge: 2014-04-18 | DRG: 467 | Disposition: A | Discharge status: Skilled Nursing Facility | Payer: PRIVATE HEALTH INSURANCE | Source: Ambulatory Visit | Attending: Orthopedic Surgery | Admitting: Orthopedic Surgery

## 2014-04-16 DIAGNOSIS — E785 Hyperlipidemia, unspecified: Secondary | ICD-10-CM | POA: Diagnosis present

## 2014-04-16 DIAGNOSIS — IMO0002 Reserved for concepts with insufficient information to code with codable children: Secondary | ICD-10-CM

## 2014-04-16 DIAGNOSIS — D500 Iron deficiency anemia secondary to blood loss (chronic): Secondary | ICD-10-CM | POA: Diagnosis not present

## 2014-04-16 DIAGNOSIS — Z6834 Body mass index (BMI) 34.0-34.9, adult: Secondary | ICD-10-CM

## 2014-04-16 DIAGNOSIS — Y831 Surgical operation with implant of artificial internal device as the cause of abnormal reaction of the patient, or of later complication, without mention of misadventure at the time of the procedure: Secondary | ICD-10-CM | POA: Diagnosis present

## 2014-04-16 DIAGNOSIS — T84099A Other mechanical complication of unspecified internal joint prosthesis, initial encounter: Principal | ICD-10-CM | POA: Diagnosis present

## 2014-04-16 DIAGNOSIS — Z888 Allergy status to other drugs, medicaments and biological substances status: Secondary | ICD-10-CM

## 2014-04-16 DIAGNOSIS — F17200 Nicotine dependence, unspecified, uncomplicated: Secondary | ICD-10-CM | POA: Diagnosis present

## 2014-04-16 DIAGNOSIS — E669 Obesity, unspecified: Secondary | ICD-10-CM | POA: Diagnosis present

## 2014-04-16 DIAGNOSIS — M069 Rheumatoid arthritis, unspecified: Secondary | ICD-10-CM | POA: Diagnosis present

## 2014-04-16 DIAGNOSIS — Z96649 Presence of unspecified artificial hip joint: Secondary | ICD-10-CM

## 2014-04-16 DIAGNOSIS — Z79899 Other long term (current) drug therapy: Secondary | ICD-10-CM

## 2014-04-16 DIAGNOSIS — M1990 Unspecified osteoarthritis, unspecified site: Secondary | ICD-10-CM | POA: Diagnosis present

## 2014-04-16 DIAGNOSIS — Z23 Encounter for immunization: Secondary | ICD-10-CM

## 2014-04-16 DIAGNOSIS — Z7982 Long term (current) use of aspirin: Secondary | ICD-10-CM

## 2014-04-16 DIAGNOSIS — N183 Chronic kidney disease, stage 3 unspecified: Secondary | ICD-10-CM | POA: Diagnosis present

## 2014-04-16 DIAGNOSIS — D62 Acute posthemorrhagic anemia: Secondary | ICD-10-CM | POA: Diagnosis not present

## 2014-04-16 DIAGNOSIS — Z833 Family history of diabetes mellitus: Secondary | ICD-10-CM

## 2014-04-16 HISTORY — PX: TOTAL HIP REVISION: SHX763

## 2014-04-16 LAB — TYPE AND SCREEN
ABO/RH(D): O POS
Antibody Screen: NEGATIVE

## 2014-04-16 LAB — ABO/RH: ABO/RH(D): O POS

## 2014-04-16 SURGERY — TOTAL HIP REVISION
Anesthesia: Spinal | Site: Hip | Laterality: Right

## 2014-04-16 MED ORDER — MAGNESIUM CITRATE PO SOLN: 1.0000 | Freq: Once | ORAL | Status: AC | PRN

## 2014-04-16 MED ORDER — HYDROMORPHONE HCL PF 1 MG/ML IJ SOLN: 0.5000 mg | INTRAMUSCULAR | Status: DC | PRN

## 2014-04-16 MED ORDER — METOCLOPRAMIDE HCL 10 MG PO TABS: 5.0000 mg | ORAL_TABLET | Freq: Three times a day (TID) | ORAL | Status: DC | PRN

## 2014-04-16 MED ORDER — DEXAMETHASONE SODIUM PHOSPHATE 10 MG/ML IJ SOLN
10.0000 mg | Freq: Once | INTRAMUSCULAR | Status: AC
  Administered 2014-04-17: 10 mg via INTRAVENOUS
  Filled 2014-04-16: qty 1

## 2014-04-16 MED ORDER — DEXAMETHASONE SODIUM PHOSPHATE 10 MG/ML IJ SOLN
INTRAMUSCULAR | Status: AC
  Filled 2014-04-16: qty 1

## 2014-04-16 MED ORDER — 0.9 % SODIUM CHLORIDE (POUR BTL) OPTIME
TOPICAL | Status: DC | PRN
  Administered 2014-04-16: 1000 mL

## 2014-04-16 MED ORDER — POLYETHYLENE GLYCOL 3350 17 G PO PACK
17.0000 g | PACK | Freq: Two times a day (BID) | ORAL | Status: DC
  Administered 2014-04-16 – 2014-04-18 (×3): 17 g via ORAL

## 2014-04-16 MED ORDER — CELECOXIB 200 MG PO CAPS
200.0000 mg | ORAL_CAPSULE | Freq: Two times a day (BID) | ORAL | Status: DC
  Administered 2014-04-16 – 2014-04-18 (×5): 200 mg via ORAL
  Filled 2014-04-16 (×6): qty 1

## 2014-04-16 MED ORDER — PROPOFOL INFUSION 10 MG/ML OPTIME
INTRAVENOUS | Status: DC | PRN
  Administered 2014-04-16: 140 ug/kg/min via INTRAVENOUS

## 2014-04-16 MED ORDER — PHENYLEPHRINE 40 MCG/ML (10ML) SYRINGE FOR IV PUSH (FOR BLOOD PRESSURE SUPPORT)
PREFILLED_SYRINGE | INTRAVENOUS | Status: AC
  Filled 2014-04-16: qty 10

## 2014-04-16 MED ORDER — CEFAZOLIN SODIUM-DEXTROSE 2-3 GM-% IV SOLR
2.0000 g | INTRAVENOUS | Status: AC
  Administered 2014-04-16: 2 g via INTRAVENOUS

## 2014-04-16 MED ORDER — PROPOFOL 10 MG/ML IV BOLUS
INTRAVENOUS | Status: AC
  Filled 2014-04-16: qty 20

## 2014-04-16 MED ORDER — LACTATED RINGERS IV SOLN
INTRAVENOUS | Status: DC | PRN
  Administered 2014-04-16 (×2): via INTRAVENOUS

## 2014-04-16 MED ORDER — MENTHOL 3 MG MT LOZG: 1.0000 | LOZENGE | OROMUCOSAL | Status: DC | PRN

## 2014-04-16 MED ORDER — DOCUSATE SODIUM 100 MG PO CAPS
100.0000 mg | ORAL_CAPSULE | Freq: Two times a day (BID) | ORAL | Status: DC
  Administered 2014-04-16 – 2014-04-18 (×4): 100 mg via ORAL

## 2014-04-16 MED ORDER — ONDANSETRON HCL 4 MG PO TABS: 4.0000 mg | ORAL_TABLET | Freq: Four times a day (QID) | ORAL | Status: DC | PRN

## 2014-04-16 MED ORDER — CEFAZOLIN SODIUM-DEXTROSE 2-3 GM-% IV SOLR
2.0000 g | Freq: Four times a day (QID) | INTRAVENOUS | Status: AC
  Administered 2014-04-16 (×2): 2 g via INTRAVENOUS
  Filled 2014-04-16 (×2): qty 50

## 2014-04-16 MED ORDER — FERROUS SULFATE 325 (65 FE) MG PO TABS
325.0000 mg | ORAL_TABLET | Freq: Three times a day (TID) | ORAL | Status: DC
  Administered 2014-04-16 – 2014-04-18 (×4): 325 mg via ORAL
  Filled 2014-04-16 (×8): qty 1

## 2014-04-16 MED ORDER — HYDROMORPHONE HCL PF 1 MG/ML IJ SOLN
INTRAMUSCULAR | Status: AC
  Filled 2014-04-16: qty 1

## 2014-04-16 MED ORDER — FENTANYL CITRATE 0.05 MG/ML IJ SOLN
INTRAMUSCULAR | Status: AC
  Filled 2014-04-16: qty 2

## 2014-04-16 MED ORDER — ASPIRIN EC 325 MG PO TBEC
325.0000 mg | DELAYED_RELEASE_TABLET | Freq: Two times a day (BID) | ORAL | Status: DC
  Administered 2014-04-17 – 2014-04-18 (×3): 325 mg via ORAL
  Filled 2014-04-16 (×5): qty 1

## 2014-04-16 MED ORDER — MIDAZOLAM HCL 5 MG/5ML IJ SOLN
INTRAMUSCULAR | Status: DC | PRN
  Administered 2014-04-16: 2 mg via INTRAVENOUS

## 2014-04-16 MED ORDER — MIDAZOLAM HCL 2 MG/2ML IJ SOLN
INTRAMUSCULAR | Status: AC
  Filled 2014-04-16: qty 2

## 2014-04-16 MED ORDER — ALUM & MAG HYDROXIDE-SIMETH 200-200-20 MG/5ML PO SUSP: 30.0000 mL | ORAL | Status: DC | PRN

## 2014-04-16 MED ORDER — LACTATED RINGERS IV SOLN: INTRAVENOUS | Status: DC

## 2014-04-16 MED ORDER — METOCLOPRAMIDE HCL 5 MG/ML IJ SOLN: 5.0000 mg | Freq: Three times a day (TID) | INTRAMUSCULAR | Status: DC | PRN

## 2014-04-16 MED ORDER — CEFAZOLIN SODIUM-DEXTROSE 2-3 GM-% IV SOLR
INTRAVENOUS | Status: AC
  Filled 2014-04-16: qty 50

## 2014-04-16 MED ORDER — CHLORHEXIDINE GLUCONATE 4 % EX LIQD: 60.0000 mL | Freq: Once | CUTANEOUS | Status: DC

## 2014-04-16 MED ORDER — ONDANSETRON HCL 4 MG/2ML IJ SOLN
4.0000 mg | Freq: Four times a day (QID) | INTRAMUSCULAR | Status: DC | PRN
  Administered 2014-04-17: 4 mg via INTRAVENOUS
  Filled 2014-04-16: qty 2

## 2014-04-16 MED ORDER — PHENYLEPHRINE HCL 10 MG/ML IJ SOLN
INTRAMUSCULAR | Status: AC
  Filled 2014-04-16: qty 1

## 2014-04-16 MED ORDER — METHOCARBAMOL 500 MG PO TABS
500.0000 mg | ORAL_TABLET | Freq: Four times a day (QID) | ORAL | Status: DC | PRN
  Administered 2014-04-16 – 2014-04-18 (×4): 500 mg via ORAL
  Filled 2014-04-16 (×4): qty 1

## 2014-04-16 MED ORDER — DIPHENHYDRAMINE HCL 25 MG PO CAPS: 25.0000 mg | ORAL_CAPSULE | Freq: Four times a day (QID) | ORAL | Status: DC | PRN

## 2014-04-16 MED ORDER — OXYCODONE HCL 5 MG PO TABS
5.0000 mg | ORAL_TABLET | ORAL | Status: DC
  Administered 2014-04-16 – 2014-04-17 (×5): 10 mg via ORAL
  Administered 2014-04-17: 5 mg via ORAL
  Administered 2014-04-17 – 2014-04-18 (×4): 10 mg via ORAL
  Filled 2014-04-16 (×11): qty 2

## 2014-04-16 MED ORDER — TRANEXAMIC ACID 100 MG/ML IV SOLN
1000.0000 mg | Freq: Once | INTRAVENOUS | Status: AC
  Administered 2014-04-16: 1000 mg via INTRAVENOUS
  Filled 2014-04-16: qty 10

## 2014-04-16 MED ORDER — HYDROMORPHONE HCL PF 1 MG/ML IJ SOLN
0.2500 mg | INTRAMUSCULAR | Status: DC | PRN
  Administered 2014-04-16 (×2): 0.25 mg via INTRAVENOUS

## 2014-04-16 MED ORDER — BUPIVACAINE HCL (PF) 0.75 % IJ SOLN
INTRAMUSCULAR | Status: DC | PRN
  Administered 2014-04-16: 15 mg via INTRATHECAL

## 2014-04-16 MED ORDER — DEXAMETHASONE SODIUM PHOSPHATE 10 MG/ML IJ SOLN
10.0000 mg | Freq: Once | INTRAMUSCULAR | Status: AC
Start: 2014-04-16 — End: 2014-04-16
  Administered 2014-04-16: 10 mg via INTRAVENOUS

## 2014-04-16 MED ORDER — FENTANYL CITRATE 0.05 MG/ML IJ SOLN
INTRAMUSCULAR | Status: DC | PRN
  Administered 2014-04-16: 50 ug via INTRAVENOUS

## 2014-04-16 MED ORDER — PHENYLEPHRINE HCL 10 MG/ML IJ SOLN
10.0000 mg | INTRAVENOUS | Status: DC | PRN
  Administered 2014-04-16: 50 ug/min via INTRAVENOUS

## 2014-04-16 MED ORDER — METHOCARBAMOL 1000 MG/10ML IJ SOLN
500.0000 mg | Freq: Four times a day (QID) | INTRAMUSCULAR | Status: DC | PRN
  Administered 2014-04-16: 500 mg via INTRAVENOUS
  Filled 2014-04-16: qty 5

## 2014-04-16 MED ORDER — BISACODYL 10 MG RE SUPP: 10.0000 mg | Freq: Every day | RECTAL | Status: DC | PRN

## 2014-04-16 MED ORDER — SODIUM CHLORIDE 0.9 % IV SOLN
100.0000 mL/h | INTRAVENOUS | Status: DC
  Administered 2014-04-16 – 2014-04-17 (×2): 100 mL/h via INTRAVENOUS
  Filled 2014-04-16 (×7): qty 1000

## 2014-04-16 MED ORDER — PHENOL 1.4 % MT LIQD: 1.0000 | OROMUCOSAL | Status: DC | PRN

## 2014-04-16 MED ORDER — PROPOFOL 10 MG/ML IV EMUL
INTRAVENOUS | Status: DC | PRN
  Administered 2014-04-16 (×2): 20 mg via INTRAVENOUS

## 2014-04-16 SURGICAL SUPPLY — 62 items
ADH SKN CLS APL DERMABOND .7 (GAUZE/BANDAGES/DRESSINGS) ×1
BAG SPEC THK2 15X12 ZIP CLS (MISCELLANEOUS) ×1
BAG ZIPLOCK 12X15 (MISCELLANEOUS) ×2 IMPLANT
BLADE SAW SGTL 18X1.27X75 (BLADE) ×2 IMPLANT
BRUSH FEMORAL CANAL (MISCELLANEOUS) IMPLANT
CUP SECTOR GRIPTON 50MM (Cup) ×1 IMPLANT
DERMABOND ADVANCED (GAUZE/BANDAGES/DRESSINGS) ×1
DERMABOND ADVANCED .7 DNX12 (GAUZE/BANDAGES/DRESSINGS) ×1 IMPLANT
DRAPE INCISE IOBAN 85X60 (DRAPES) ×2 IMPLANT
DRAPE ORTHO SPLIT 77X108 STRL (DRAPES) ×4
DRAPE POUCH INSTRU U-SHP 10X18 (DRAPES) ×2 IMPLANT
DRAPE SURG 17X11 SM STRL (DRAPES) ×2 IMPLANT
DRAPE SURG ORHT 6 SPLT 77X108 (DRAPES) ×2 IMPLANT
DRAPE U-SHAPE 47X51 STRL (DRAPES) ×2 IMPLANT
DRSG AQUACEL AG ADV 3.5X10 (GAUZE/BANDAGES/DRESSINGS) IMPLANT
DRSG AQUACEL AG ADV 3.5X14 (GAUZE/BANDAGES/DRESSINGS) ×2 IMPLANT
DRSG EMULSION OIL 3X16 NADH (GAUZE/BANDAGES/DRESSINGS) ×2 IMPLANT
DRSG MEPILEX BORDER 4X4 (GAUZE/BANDAGES/DRESSINGS) ×2 IMPLANT
DRSG MEPILEX BORDER 4X8 (GAUZE/BANDAGES/DRESSINGS) ×2 IMPLANT
DRSG TEGADERM 4X4.75 (GAUZE/BANDAGES/DRESSINGS) ×2 IMPLANT
DURAPREP 26ML APPLICATOR (WOUND CARE) ×2 IMPLANT
ELECT BLADE TIP CTD 4 INCH (ELECTRODE) ×2 IMPLANT
ELECT REM PT RETURN 9FT ADLT (ELECTROSURGICAL) ×2
ELECTRODE REM PT RTRN 9FT ADLT (ELECTROSURGICAL) ×1 IMPLANT
ELIMINATOR HOLE APEX DEPUY (Hips) ×1 IMPLANT
EVACUATOR 1/8 PVC DRAIN (DRAIN) ×2 IMPLANT
FACESHIELD WRAPAROUND (MASK) ×8 IMPLANT
FACESHIELD WRAPAROUND OR TEAM (MASK) ×4 IMPLANT
GAUZE SPONGE 2X2 8PLY STRL LF (GAUZE/BANDAGES/DRESSINGS) ×1 IMPLANT
GLOVE BIOGEL PI IND STRL 7.5 (GLOVE) ×1 IMPLANT
GLOVE BIOGEL PI IND STRL 8 (GLOVE) ×1 IMPLANT
GLOVE BIOGEL PI INDICATOR 7.5 (GLOVE) ×1
GLOVE BIOGEL PI INDICATOR 8 (GLOVE) ×1
GLOVE ECLIPSE 8.0 STRL XLNG CF (GLOVE) ×4 IMPLANT
GOWN SPEC L3 XXLG W/TWL (GOWN DISPOSABLE) ×4 IMPLANT
GOWN STRL REUS W/TWL LRG LVL3 (GOWN DISPOSABLE) ×2 IMPLANT
HANDPIECE INTERPULSE COAX TIP (DISPOSABLE) ×2
HEAD DELTA CERAMIC 32 P9 12/14 (Knees) ×1 IMPLANT
KIT BASIN OR (CUSTOM PROCEDURE TRAY) ×2 IMPLANT
LINER PINN ALTRX ACE P4 HIP (Liner) ×1 IMPLANT
MANIFOLD NEPTUNE II (INSTRUMENTS) ×2 IMPLANT
NS IRRIG 1000ML POUR BTL (IV SOLUTION) ×4 IMPLANT
PACK TOTAL JOINT (CUSTOM PROCEDURE TRAY) ×2 IMPLANT
POSITIONER SURGICAL ARM (MISCELLANEOUS) ×2 IMPLANT
PRESSURIZER FEMORAL UNIV (MISCELLANEOUS) IMPLANT
SCREW 6.5MMX25MM (Screw) ×1 IMPLANT
SCREW PINN CAN 6.5X20 (Screw) ×1 IMPLANT
SET HNDPC FAN SPRY TIP SCT (DISPOSABLE) IMPLANT
SPONGE GAUZE 2X2 STER 10/PKG (GAUZE/BANDAGES/DRESSINGS) ×1
SPONGE LAP 18X18 X RAY DECT (DISPOSABLE) ×1 IMPLANT
SPONGE LAP 4X18 X RAY DECT (DISPOSABLE) ×1 IMPLANT
STAPLER VISISTAT 35W (STAPLE) ×2 IMPLANT
SUCTION FRAZIER TIP 10 FR DISP (SUCTIONS) ×2 IMPLANT
SUT MNCRL AB 4-0 PS2 18 (SUTURE) ×1 IMPLANT
SUT VIC AB 1 CT1 36 (SUTURE) ×6 IMPLANT
SUT VIC AB 2-0 CT1 27 (SUTURE) ×6
SUT VIC AB 2-0 CT1 TAPERPNT 27 (SUTURE) ×3 IMPLANT
SUT VLOC 180 0 24IN GS25 (SUTURE) ×4 IMPLANT
TOWEL OR 17X26 10 PK STRL BLUE (TOWEL DISPOSABLE) ×4 IMPLANT
TOWER CARTRIDGE SMART MIX (DISPOSABLE) IMPLANT
TRAY FOLEY CATH 14FRSI W/METER (CATHETERS) ×2 IMPLANT
WATER STERILE IRR 1500ML POUR (IV SOLUTION) ×2 IMPLANT

## 2014-04-16 NOTE — Brief Op Note (Signed)
04/16/2014  12:58 PM  PATIENT:  Veronica Warren  63 y.o. female  PRE-OPERATIVE DIAGNOSIS:  FAILED RIGHT TOTAL HIP ARTHOPLASTY,Metallosis   POST-OPERATIVE DIAGNOSIS:  FAILED RIGHT TOTAL HIP ARTHOPLASTY,Metallosis  PROCEDURE:  Procedure(s): TOTAL HIP REVISION ARTHOPLASTY (Right)  SURGEON:  Surgeon(s) and Role:    * Mauri Pole, MD - Primary  PHYSICIAN ASSISTANT: Danae Orleans, PA-C  ANESTHESIA:   spinal  EBL:  Total I/O In: 1000 [I.V.:1000] Out: -   BLOOD ADMINISTERED:none  DRAINS: none   LOCAL MEDICATIONS USED:  NONE  SPECIMEN:  Source of Specimen:  Left hip component, including metal acetabular shell, metal liner and head ball  DISPOSITION OF SPECIMEN:  PATHOLOGY  COUNTS:  YES  TOURNIQUET:  * No tourniquets in log *  DICTATION: .Other Dictation: Dictation Number [id number]  PLAN OF CARE: Admit to inpatient   PATIENT DISPOSITION:  PACU - hemodynamically stable.   Delay start of Pharmacological VTE agent (>24hrs) due to surgical blood loss or risk of bleeding: no

## 2014-04-16 NOTE — Anesthesia Preprocedure Evaluation (Addendum)
Anesthesia Evaluation  Patient identified by MRN, date of birth, ID band Patient awake    Reviewed: Allergy & Precautions, H&P , NPO status , Patient's Chart, lab work & pertinent test results  Airway Mallampati: II TM Distance: >3 FB Neck ROM: full    Dental no notable dental hx. (+) Teeth Intact, Dental Advisory Given   Pulmonary neg pulmonary ROS, Current Smoker,  breath sounds clear to auscultation  Pulmonary exam normal       Cardiovascular Exercise Tolerance: Good negative cardio ROS  Rhythm:regular Rate:Normal     Neuro/Psych negative neurological ROS  negative psych ROS   GI/Hepatic negative GI ROS, Neg liver ROS,   Endo/Other  negative endocrine ROS  Renal/GU Renal diseaseStage 3 kidney disease  negative genitourinary   Musculoskeletal  (+) Arthritis -, Rheumatoid disorders,    Abdominal   Peds  Hematology negative hematology ROS (+)   Anesthesia Other Findings   Reproductive/Obstetrics negative OB ROS                         Anesthesia Physical Anesthesia Plan  ASA: III  Anesthesia Plan: Spinal   Post-op Pain Management:    Induction:   Airway Management Planned: Simple Face Mask  Additional Equipment:   Intra-op Plan:   Post-operative Plan:   Informed Consent: I have reviewed the patients History and Physical, chart, labs and discussed the procedure including the risks, benefits and alternatives for the proposed anesthesia with the patient or authorized representative who has indicated his/her understanding and acceptance.   Dental Advisory Given  Plan Discussed with: CRNA and Surgeon  Anesthesia Plan Comments:        Anesthesia Quick Evaluation

## 2014-04-16 NOTE — Interval H&P Note (Signed)
History and Physical Interval Note:  04/16/2014 10:32 AM  Veronica Warren  has presented today for surgery, with the diagnosis of FAILED RIGHT TOTAL HIP ARTHOPLASTY,TRUNIONOSIS  The various methods of treatment have been discussed with the patient and family. After consideration of risks, benefits and other options for treatment, the patient has consented to  Procedure(s): TOTAL HIP REVISION ARTHOPLASTY (Right) as a surgical intervention .  The patient's history has been reviewed, patient examined, no change in status, stable for surgery.  I have reviewed the patient's chart and labs.  Questions were answered to the patient's satisfaction.     Mauri Pole

## 2014-04-16 NOTE — Transfer of Care (Signed)
Immediate Anesthesia Transfer of Care Note  Patient: Veronica Warren  Procedure(s) Performed: Procedure(s): TOTAL HIP REVISION ARTHOPLASTY (Right)  Patient Location: PACU  Anesthesia Type:Spinal  Level of Consciousness: awake and alert   Airway & Oxygen Therapy: Patient Spontanous Breathing and Patient connected to face mask oxygen  Post-op Assessment: Report given to PACU RN and Post -op Vital signs reviewed and stable  Post vital signs: Reviewed and stable  Complications: No apparent anesthesia complications

## 2014-04-16 NOTE — Anesthesia Procedure Notes (Signed)
Spinal  Patient location during procedure: OR Start time: 04/16/2014 11:16 AM End time: 04/16/2014 11:22 AM Staffing Anesthesiologist: Rod Mae L Performed by: anesthesiologist  Preanesthetic Checklist Completed: patient identified, site marked, surgical consent, pre-op evaluation, timeout performed, IV checked, risks and benefits discussed and monitors and equipment checked Spinal Block Patient position: sitting Prep: Betadine Patient monitoring: heart rate, continuous pulse ox and blood pressure Approach: midline Location: L3-4 Injection technique: single-shot Needle Needle type: [place]  Needle gauge: 25 G Needle length: 9 cm Assessment Sensory level: T6 Additional Notes Expiration date of kit checked and confirmed. Patient tolerated procedure well, without complications.

## 2014-04-16 NOTE — Anesthesia Postprocedure Evaluation (Signed)
  Anesthesia Post-op Note  Patient: Veronica Warren  Procedure(s) Performed: Procedure(s) (LRB): TOTAL HIP REVISION ARTHOPLASTY (Right)  Patient Location: PACU  Anesthesia Type: Spinal  Level of Consciousness: awake and alert   Airway and Oxygen Therapy: Patient Spontanous Breathing  Post-op Pain: mild  Post-op Assessment: Post-op Vital signs reviewed, Patient's Cardiovascular Status Stable, Respiratory Function Stable, Patent Airway and No signs of Nausea or vomiting  Last Vitals:  Filed Vitals:   04/16/14 1429  BP: 103/60  Pulse:   Temp:   Resp:     Post-op Vital Signs: stable   Complications: No apparent anesthesia complications

## 2014-04-16 NOTE — Progress Notes (Addendum)
Clinical Social Work Department CLINICAL SOCIAL WORK PLACEMENT NOTE 04/16/2014  Patient:  Veronica Warren, Veronica Warren  Account Number:  [id number] Admit date:  04/16/2014  Clinical Social Worker:  Werner Lean, LCSW  Date/time:  04/16/2014 04:19 PM  Clinical Social Work is seeking post-discharge placement for this patient at the following level of care:   SKILLED NURSING   (*CSW will update this form in Epic as items are completed)     Patient/family provided with [facility] Department of Clinical Social Work's list of facilities offering this level of care within the geographic area requested by the patient (or if unable, by the patient's family).  04/16/2014  Patient/family informed of their freedom to choose among providers that offer the needed level of care, that participate in Medicare, Medicaid or managed care program needed by the patient, have an available bed and are willing to accept the patient.    Patient/family informed of [facility]' ownership interest in [facility], as well as of the fact that they are under no obligation to receive care at this facility.  PASARR submitted to EDS on 04/16/2014 PASARR number received from EDS on 04/16/2014  FL2 transmitted to all facilities in geographic area requested by pt/family on  04/16/2014 FL2 transmitted to all facilities within larger geographic area on   Patient informed that his/her managed care company has contracts with or will negotiate with  certain facilities, including the following:     Patient/family informed of bed offers received:  04/16/2014 Patient chooses bed at [facility] Physician recommends and patient chooses bed at    Patient to be transferred to [facility]   on  04/18/14 Patient to be transferred to facility by husband Marketing executive)  The following physician request were entered in Epic:   Additional Comments:  Eduard Clos, [place] (coverage)  [phone]

## 2014-04-16 NOTE — Progress Notes (Signed)
Clinical Social Work Department BRIEF PSYCHOSOCIAL ASSESSMENT 04/16/2014  Patient:  Veronica Warren, Veronica Warren     Account Number:  [id number]     Admit date:  04/16/2014  Clinical Social Worker:  Lacie Scotts  Date/Time:  04/16/2014 04:10 PM  Referred by:  Physician  Date Referred:  04/16/2014 Referred for  SNF Placement   Other Referral:   Interview type:  Patient Other interview type:    PSYCHOSOCIAL DATA Living Status:  HUSBAND Admitted from facility:   Level of care:   Primary support name:  John Primary support relationship to patient:  SPOUSE Degree of support available:   supportive    CURRENT CONCERNS Current Concerns  Post-Acute Placement   Other Concerns:    SOCIAL WORK ASSESSMENT / PLAN Pt is a 63 yr old female living at home with spouse prior to hospitalization. CSW met with pt / spouse to assist with d/c planning. This is a planned admission. Pt has made prior arrangements to have ST Rehab at [facility] following hospital d/c. CSW has contacted SNF and d/c plan has been confirmed. SNF has been in contact with pt's insurance carrier and prior approval for placement has been received. CSW will continue to follow to assist with d/c planning.   Assessment/plan status:  Psychosocial Support/Ongoing Assessment of Needs Other assessment/ plan:   Information/referral to community resources:   Insurance coverage for SNF and ambulance transport has been reviewed.    PATIENT'S/FAMILY'S RESPONSE TO PLAN OF CARE: Pt's mood is bright. Her pain is controlled. Pt is looking forward to starting therapy. She appears motivated .    Werner Lean LCSW [phone]

## 2014-04-17 DIAGNOSIS — E669 Obesity, unspecified: Secondary | ICD-10-CM | POA: Diagnosis present

## 2014-04-17 LAB — BASIC METABOLIC PANEL
BUN: 17 mg/dL (ref 6–23)
CALCIUM: 9.1 mg/dL (ref 8.4–10.5)
CO2: 26 meq/L (ref 19–32)
Chloride: 105 mEq/L (ref 96–112)
Creatinine, Ser: 1.04 mg/dL (ref 0.50–1.10)
GFR calc Af Amer: 66 mL/min — ABNORMAL LOW (ref >90)
GFR calc non Af Amer: 57 mL/min — ABNORMAL LOW (ref >90)
Glucose, Bld: 147 mg/dL — ABNORMAL HIGH (ref 70–99)
POTASSIUM: 5.3 meq/L (ref 3.7–5.3)
SODIUM: 139 meq/L (ref 137–147)

## 2014-04-17 LAB — CBC
HCT: 37.4 % (ref 36.0–46.0)
Hemoglobin: 12.1 g/dL (ref 12.0–15.0)
MCH: 28.1 pg (ref 26.0–34.0)
MCHC: 32.4 g/dL (ref 30.0–36.0)
MCV: 86.8 fL (ref 78.0–100.0)
Platelets: 187 10*3/uL (ref 150–400)
RBC: 4.31 MIL/uL (ref 3.87–5.11)
RDW: 13.2 % (ref 11.5–15.5)
WBC: 14.3 10*3/uL — ABNORMAL HIGH (ref 4.0–10.5)

## 2014-04-17 NOTE — Op Note (Signed)
NAMEMALAI, Veronica Warren NO.:  [id number]  MEDICAL RECORD NO.:  [id number]  LOCATION:  3419                         FACILITY:  [facility]  PHYSICIAN:  Pietro Cassis. Alvan Dame, M.D.  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  FINDINGS:  The patient was noted to have metal staining of synovial fluid, no evidence of any muscle necrosis.  The acetabular bone stock appeared to be sclerotic, previous reaming had gone down too and slightly through medial wall.  There was no significant bone loss on cup removal.  PROCEDURE:  Revision right total hip arthroplasty utilizing auto bone graft from acetabulum.  COMPONENTS USED:  DePuy size 50 Gription Pinnacle shell, two cancellous bone screws, 32+ 4 10-degree face-changing liner, and a 32+ 9 delta ceramic ball with the Titanium inner sleeve.  SURGEON:  Pietro Cassis. Alvan Dame, M.D.  ASSISTANT:  Danae Orleans, PA-C.  Note that Mr. Veronica Warren was present for the entirety of the case from preoperative positioning, perioperative management of the operative extremity, general facilitation of the case, and primary wound closure.  ANESTHESIA:  Spinal.  SPECIMENS:  The patient's acetabular shell with intact liner and femoral head ball were sent to Pathology based on attorney's request with the concerns of metallosis.  DRAINS:  None.  BLOOD LOSS:  About 300 mL.  COMPLICATION:  None apparent.  INDICATION FOR PROCEDURE:  Veronica Warren is a 63 year old female who presented to the office for second opinion evaluation of painful hip. Upon workup, she was noted to have slightly elevated inflammatory markers of particular C-reactive protein, but marked elevation in asymmetry between her Cobalt and  chromium levels.  Based on her persistence of pain and MRI findings, she wished to proceed with revision surgery.  We discussed the risks of infection, dislocation in revision setting, the potential benefit of removing the metal articulating surface.  Based on the asymmetry of her Cobalt and chromium, this appeared to be more of a trunnion issue based on recent studies and data.  After reviewing the risks, she wished to proceed for benefit of pain relief and eliminating metal concerns.  PROCEDURE IN DETAIL:  The patient was brought to the operative theater. Once adequate anesthesia, preoperative antibiotics, Ancef administered, she was positioned into the left lateral decubitus position with the right side up.  The right lower extremity was then prepped and draped in sterile fashion.  Time-out was performed identifying the patient, planned procedure, and extremity.  Her old posterior southern-type incision was utilized, excising skin down soft tissues.  Soft tissue planes created down to the iliotibial band.  The iliotibial band and gluteal fascia were then incised for posterior approach to the hip, removing old Ethibond sutures.  Once the gluteus muscle was split, the posterior aspect of the hip was exposed, and beginning at the level of her posterior trochanter, the pseudocapsule was opened encountering synovial fluid that appeared to be slightly metal stained with no signs of infection.  Attention was first directed  to perform synovectomy in this area where the pseudocapsular layer internally debrided.  Once I had the posterior two-thirds of the hip exposed, I was able to dislocate the hip, removed the femoral head, identifying some trunnion changes with metal staining. The trunnion otherwise was intact.  I had elevated the capsule and gluteus muscles off of the ilium allowing for placement of the trunnion.  The trunnion was placed onto the ilium to expose for acetabular work.   Further synovectomy and debridement of any metal-stained tissues was carried out on the anterior third of the hip.  The patient had a previous acetabular component placed without the screws, we assessed the cup position and noting abduction probably close to 50 degrees and forward flexion almost matching that.  This was assessed using an acetabular guide.  At this point, using the Innomed extraction set, the acetabular component was removed without bone loss. Assessing the acetabulum, we identified that the reaming had gone through the medial wall slightly before the index surgery.  There was not much bone on the posterior wall.  Given this, I started with a 46-mm reamer with a goal of reaming away any of the subchondral bone still present as well as some evidence of metal-stained bone.  I also used a curette for this.  I only reamed up to 49 mm reamer as I did not want to further ream out more for anterior and posterior walls.  Given the sclerotic nature of her bone, I did use a drill and drilled multiple holes into the ilium to try to stimulate blood and bone egress from the ilium onto the acetabular shell.  I selected a 50-mm Gription Pinnacle shell and using curved impactor and impacted the cup.  The cup position seemed to be positioned at this point now at 35-40 degrees of abduction and forward flexed about 20 degrees.  It was slightly below the anterior wall of the acetabulum.  At this point, I was able to get two cancellous screws, one anterior superior and one slightly posterior superior with good cortical bites.  These were done without apparent complication.  I did place a 32+ 4 neutral liner given the fact this is a 50-mm shell.  At this point, I did a trial reduction initially with 32+ 5 trial ball. The combined anteversion seemed to be less than 40 or 50 degrees at this point.  Thus, I determined I would use a +10 face-changing liner.  With a +4 10-degree face-changing  liner and a +9 ball, the hip was very stable, did not appear to affect her leg lengths.  The hip was stable without evidence of any impingement.  Given these findings, the trial components were removed.  I placed a hole eliminator and impacted a 32+ 4 10-degree face-changing Ultrex liner with the lip portion positioned between 8 and 9 o'clock position for this right hip.  I then opened up the size 32+ 9 delta ceramic ball with a Titanium insert.  It was impacted onto clean trunnion.  The hip was reduced.  The hip had been irrigated throughout the case and again at this point.  Based on the dissection required for debridement of the pseudocapsule and synovium, I did not reapproximate the posterior capsule instead reapproximated the iliotibial band and gluteal fascia using a combination of #1 Vicryl and 0 V-Loc sutures.  The remainder of the wound was closed with 2-0 Vicryl and running 3-0 Monocryl.  The hip was then cleaned, dried and dressed sterilely using Dermabond  and Aquacel dressing.  She was then brought to the recovery room in stable condition tolerating the procedure well followings review with family, again material sent to Pathology for attorney purposes.     Pietro Cassis Alvan Dame, M.D.     MDO/MEDQ  D:  04/16/2014  T:  04/17/2014  Job:  [id number]

## 2014-04-17 NOTE — Progress Notes (Signed)
Patient ID: Veronica Warren, female   DOB: 23-Jan-1951, 63 y.o.   MRN: [id number] Subjective: 1 Day Post-Op Procedure(s) (LRB): TOTAL HIP REVISION ARTHOPLASTY (Right)    Patient reports pain as mild to moderate, not much sleep last night, no activity out of bed yesterday  Objective:   VITALS:   Filed Vitals:   04/17/14 0900  BP: 101/67  Pulse: 64  Temp: 97.9 F (36.6 C)  Resp: 18    Neurovascular intact Incision: dressing C/D/I  LABS  Recent Labs  04/17/14 0456  HGB 12.1  HCT 37.4  WBC 14.3*  PLT 187     Recent Labs  04/17/14 0456  NA 139  K 5.3  BUN 17  CREATININE 1.04  GLUCOSE 147*    No results found for this basename: LABPT, INR,  in the last 72 hours   Assessment/Plan: 1 Day Post-Op Procedure(s) (LRB): TOTAL HIP REVISION ARTHOPLASTY (Right)   Advance diet Up with therapy Plan for discharge tomorrow Discharge home with home health tomorrow

## 2014-04-17 NOTE — Progress Notes (Signed)
CARE MANAGEMENT NOTE 04/17/2014  Patient:  Veronica Warren, Veronica Warren   Account Number:  [id number]  Date Initiated:  04/17/2014  Documentation initiated by:  DAVIS,RHONDA  Subjective/Objective Assessment:   rt tha revision     Action/Plan:   snf post hospital-[facility]   Anticipated DC Date:  04/20/2014   Anticipated DC Plan:  SKILLED NURSING FACILITY  In-house referral  Clinical Social Worker      DC Planning Services  NA      [facility] Choice  NA   Choice offered to / List presented to:  NA   DME arranged  NA      DME agency  NA     [place] arranged  NA      [place] agency  NA   Status of service:  In process, will continue to follow Medicare Important Message given?  NA - LOS <3 / Initial given by admissions (If response is "NO", the following Medicare IM given date fields will be blank) Date Medicare IM given:   Date Additional Medicare IM given:    Discharge Disposition:    Per UR Regulation:  Reviewed for med. necessity/level of care/duration of stay  If discussed at [facility] of Stay Meetings, dates discussed:    Comments:  [id number]/Rhonda Eldridge Dace, [place], [place] [phone] Chart Reviewed for discharge and hospital needs. Discharge needs at time of review: None present will follow for needs. Review of patient progress due on [id number]

## 2014-04-17 NOTE — Progress Notes (Signed)
Physical Therapy Treatment Note   04/17/14 1600  PT Visit Information  Last PT Received On 04/17/14  Assistance Needed +1  History of Present Illness Pt is a 63 year old female s/p R THA revision due to metallosis.  PT Time Calculation  PT Start Time 1425  PT Stop Time 1438  PT Time Calculation (min) 13 min  Subjective Data  Subjective Pt ambulated in hallway again and reports feeling good this afternoon.  Reviewed posterior hip precautions and answered pt's questions.  Precautions  Precautions Fall;Posterior Hip  Restrictions  Weight Bearing Restrictions Yes  RLE Weight Bearing PWB  RLE Partial Weight Bearing Percentage or Pounds 50  Cognition  Arousal/Alertness Awake/alert  Behavior During Therapy WFL for tasks assessed/performed  Overall Cognitive Status Within Functional Limits for tasks assessed  Transfers  Overall transfer level Needs assistance  Equipment used Rolling walker (2 wheeled)  Transfers Sit to/from Stand  Sit to Stand Min guard  General transfer comment verbal cues for safe technique within precautions  Ambulation/Gait  Ambulation/Gait assistance Min guard  Ambulation Distance (Feet) 180 Feet  Assistive device Rolling walker (2 wheeled)  Gait Pattern/deviations Step-to pattern;Antalgic  General Gait Details verbal cues for PWB, sequence, RW distance, preferred step through pattern this afternoon   PT - End of Session  Activity Tolerance Patient tolerated treatment well  Patient left in chair;with call bell/phone within reach  PT - Assessment/Plan  PT Plan Current plan remains appropriate  PT Frequency 7X/week  Follow Up Recommendations SNF  PT equipment None recommended by PT  PT Goal Progression  Progress towards PT goals Progressing toward goals  PT General Charges  $$ ACUTE PT VISIT 1 Procedure  PT Treatments  $Gait Training 8-22 mins   Carmelia Bake, PT, DPT 04/17/2014 Pager: [phone]

## 2014-04-17 NOTE — Evaluation (Signed)
Physical Therapy Evaluation Patient Details Name: Veronica Warren MRN: [id number] DOB: [birth year] Today's Date: 04/17/2014   History of Present Illness  Pt is a 63 year old female s/p R THA revision due to metallosis.  Clinical Impression  Pt is s/p R THA revision resulting in the deficits listed below (see PT Problem List).  Pt will benefit from skilled PT to increase their independence and safety with mobility to allow discharge to the venue listed below.  Pt mobilizing well POD#1 and reports good pain control.  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  Pt plans to d/c to SNF.     Follow Up Recommendations SNF    Equipment Recommendations  None recommended by PT    Recommendations for Other Services       Precautions / Restrictions Precautions Precautions: Fall;Posterior Hip Restrictions Weight Bearing Restrictions: Yes RLE Weight Bearing: Partial weight bearing RLE Partial Weight Bearing Percentage or Pounds: 50      Mobility  Bed Mobility               General bed mobility comments: pt up in recliner on arrival  Transfers Overall transfer level: Needs assistance Equipment used: Rolling walker (2 wheeled) Transfers: Sit to/from Stand Sit to Stand: Min guard         General transfer comment: verbal cues for safe technique within precautions  Ambulation/Gait Ambulation/Gait assistance: Min guard Ambulation Distance (Feet): 120 Feet Assistive device: Rolling walker (2 wheeled) Gait Pattern/deviations: Step-to pattern;Step-through pattern;Antalgic     General Gait Details: verbal cues for PWB, sequence, RW distance, progressed to step through pattern  Stairs            Wheelchair Mobility    Modified Rankin (Stroke Patients Only)       Balance                                             Pertinent Vitals/Pain  Reports minimal R hip pain, premedicated, activity to tolerance    Home Living Family/patient expects to be discharged to:: Skilled nursing facility                      Prior Function Level of Independence: Independent               Hand Dominance        Extremity/Trunk Assessment               Lower Extremity Assessment: RLE deficits/detail RLE Deficits / Details: decreased functional hip strength observed       Communication   Communication: No difficulties  Cognition Arousal/Alertness: Awake/alert Behavior During Therapy: WFL for tasks assessed/performed Overall Cognitive Status: Within Functional Limits for tasks assessed                      General Comments      Exercises Total Joint Exercises Ankle Circles/Pumps: AROM;Both;15 reps Quad Sets: AROM;Both;15 reps Gluteal Sets: AROM;Both;15 reps Towel Squeeze: AROM;Both;15 reps Short Arc Quad: AROM;Right;15 reps Heel Slides: AROM;Right;15 reps Hip ABduction/ADduction: AROM;Right;15 reps Other Exercises Other Exercises: all exercises within precautions      Assessment/Plan    PT Assessment Patient needs continued PT services  PT Diagnosis Difficulty walking   PT Problem  List Decreased strength;Decreased mobility;Decreased knowledge of precautions;Pain  PT Treatment Interventions Stair training;Gait training;DME instruction;Functional mobility training;Therapeutic exercise;Therapeutic activities;Patient/family education   PT Goals (Current goals can be found in the Care Plan section) Acute Rehab PT Goals PT Goal Formulation: With patient Time For Goal Achievement: 04/21/14 Potential to Achieve Goals: Good    Frequency 7X/week   Barriers to discharge        Co-evaluation               End of Session   Activity Tolerance: Patient tolerated treatment well Patient left: in chair;with call bell/phone within reach           Time: 0926-0953 PT Time Calculation  (min): 27 min   Charges:   PT Evaluation $Initial PT Evaluation Tier I: 1 Procedure PT Treatments $Gait Training: 8-22 mins $Therapeutic Exercise: 8-22 mins   PT G CodesJunius Argyle 04/17/2014, 11:26 AM Carmelia Bake, PT, DPT 04/17/2014 Pager: [phone]

## 2014-04-17 NOTE — Progress Notes (Signed)
   Subjective: 1 Day Post-Op Procedure(s) (LRB): TOTAL HIP REVISION ARTHOPLASTY (Right)   Seen by Dr. Alvan Dame. Patient reports pain as mild, pain controlled. No events throughout the night. Denies any CP or SOB.   Objective:   VITALS:   Filed Vitals:   04/17/14  BP: 98/63  Pulse: 64  Temp: 97.5 F (36.4 C)   Resp: 18    Neurovascular intact Dorsiflexion/Plantar flexion intact Incision: dressing C/D/I No cellulitis present Compartment soft  LABS  Recent Labs  04/17/14 0456  HGB 12.1  HCT 37.4  WBC 14.3*  PLT 187     Recent Labs  04/17/14 0456  NA 139  K 5.3  BUN 17  CREATININE 1.04  GLUCOSE 147*     Assessment/Plan: 1 Day Post-Op Procedure(s) (LRB): TOTAL HIP REVISION ARTHOPLASTY (Right) Foley cath d/c'ed Advance diet Up with therapy D/C IV fluids Discharge to SNF eventually, when ready  Obese (BMI 30-39.9) Estimated body mass index is 33.13 kg/(m^2) as calculated from the following:   Height as of this encounter: 5\' 3"  (1.6 m).   Weight as of this encounter: 84.823 kg (187 lb). Patient also counseled that weight may inhibit the healing process Patient counseled that losing weight will help with future health issues       West Pugh. Ana Woodroof   PAC  04/17/2014, 9:46 AM

## 2014-04-18 DIAGNOSIS — D500 Iron deficiency anemia secondary to blood loss (chronic): Secondary | ICD-10-CM | POA: Diagnosis not present

## 2014-04-18 LAB — BASIC METABOLIC PANEL
BUN: 18 mg/dL (ref 6–23)
CALCIUM: 9.1 mg/dL (ref 8.4–10.5)
CO2: 27 meq/L (ref 19–32)
CREATININE: 0.95 mg/dL (ref 0.50–1.10)
Chloride: 105 mEq/L (ref 96–112)
GFR calc Af Amer: 73 mL/min — ABNORMAL LOW (ref >90)
GFR calc non Af Amer: 63 mL/min — ABNORMAL LOW (ref >90)
GLUCOSE: 139 mg/dL — AB (ref 70–99)
Potassium: 4.4 mEq/L (ref 3.7–5.3)
Sodium: 141 mEq/L (ref 137–147)

## 2014-04-18 LAB — CBC
HCT: 34.7 % — ABNORMAL LOW (ref 36.0–46.0)
HEMOGLOBIN: 11.3 g/dL — AB (ref 12.0–15.0)
MCH: 28.6 pg (ref 26.0–34.0)
MCHC: 32.6 g/dL (ref 30.0–36.0)
MCV: 87.8 fL (ref 78.0–100.0)
Platelets: 184 10*3/uL (ref 150–400)
RBC: 3.95 MIL/uL (ref 3.87–5.11)
RDW: 13.6 % (ref 11.5–15.5)
WBC: 18.2 10*3/uL — ABNORMAL HIGH (ref 4.0–10.5)

## 2014-04-18 MED ORDER — FERROUS SULFATE 325 (65 FE) MG PO TABS: 325.0000 mg | ORAL_TABLET | Freq: Three times a day (TID) | ORAL | Status: DC

## 2014-04-18 MED ORDER — METHOCARBAMOL 500 MG PO TABS: 500.0000 mg | ORAL_TABLET | Freq: Four times a day (QID) | ORAL | Status: DC | PRN

## 2014-04-18 MED ORDER — OXYCODONE HCL 5 MG PO TABS: 5.0000 mg | ORAL_TABLET | ORAL | Status: DC

## 2014-04-18 MED ORDER — DSS 100 MG PO CAPS: 100.0000 mg | ORAL_CAPSULE | Freq: Two times a day (BID) | ORAL | Status: DC

## 2014-04-18 MED ORDER — ASPIRIN 325 MG PO TBEC: 325.0000 mg | DELAYED_RELEASE_TABLET | Freq: Two times a day (BID) | ORAL | Status: AC

## 2014-04-18 MED ORDER — POLYETHYLENE GLYCOL 3350 17 G PO PACK: 17.0000 g | PACK | Freq: Two times a day (BID) | ORAL | Status: DC

## 2014-04-18 NOTE — Progress Notes (Signed)
   Subjective: 2 Days Post-Op Procedure(s) (LRB): TOTAL HIP REVISION ARTHOPLASTY (Right)   Patient reports pain as mild, pain controlled. No events throughout the night.  Feels that she is working well with PT. Ready to be discharged to skilled nursing facility.  Objective:   VITALS:   Filed Vitals:   04/18/14 0431  BP: 102/60  Pulse: 74  Temp: 97.9 F (36.6 C)  Resp: 16    Neurovascular intact Dorsiflexion/Plantar flexion intact Incision: dressing C/D/I No cellulitis present Compartment soft  LABS  Recent Labs  04/17/14 0456 04/18/14 0425  HGB 12.1 11.3*  HCT 37.4 34.7*  WBC 14.3* 18.2*  PLT 187 184     Recent Labs  04/17/14 0456 04/18/14 0425  NA 139 141  K 5.3 4.4  BUN 17 18  CREATININE 1.04 0.95  GLUCOSE 147* 139*     Assessment/Plan: 2 Days Post-Op Procedure(s) (LRB): TOTAL HIP REVISION ARTHOPLASTY (Right) Up with therapy Discharge to SNF today Follow up in 2 weeks at [facility]. Follow up with OLIN,Mikeal Winstanley D in 2 weeks.  Contact information:  [facility] [address] [place] [phone]    Expected ABLA  Treated with iron and will observe  Obese (BMI 30-39.9) Estimated body mass index is 33.13 kg/(m^2) as calculated from the following:   Height as of this encounter: 5\' 3"  (1.6 m).   Weight as of this encounter: 84.823 kg (187 lb). Patient also counseled that weight may inhibit the healing process Patient counseled that losing weight will help with future health issues        West Pugh. Alastair Hennes   PAC  04/18/2014, 8:12 AM

## 2014-04-18 NOTE — Progress Notes (Signed)
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  PT Time Calculation  PT Start Time 1031  PT Stop Time 1055  PT Time Calculation (min) 24 min  Subjective Data  Subjective Pt feeling good today and able to tolerate increase in ambulation distance.  Pt performed exercises in recliner as well.  Reviewed posterior hip precautions and PWB as well.  Pt also having husband transport to SNF so discussed car transfers.  Precautions  Precautions Fall;Posterior Hip  Restrictions  Weight Bearing Restrictions Yes  RLE Weight Bearing PWB  RLE Partial Weight Bearing Percentage or Pounds 50  Cognition  Arousal/Alertness Awake/alert  Behavior During Therapy WFL for tasks assessed/performed  Overall Cognitive Status Within Functional Limits for tasks assessed  Bed Mobility  Overal bed mobility Needs Assistance  Bed Mobility Supine to Sit  Supine to sit Supervision  General bed mobility comments verbal cues for precautions  Transfers  Overall transfer level Needs assistance  Equipment used Rolling walker (2 wheeled)  Transfers Sit to/from Stand  Sit to Stand Supervision  General transfer comment verbal cues for safe technique within precautions  Ambulation/Gait  Ambulation/Gait assistance Supervision  Ambulation Distance (Feet) 240 Feet  Assistive device Rolling walker (2 wheeled)  Gait Pattern/deviations Step-through pattern;Decreased step length - left  General Gait Details verbal cues for PWB, heel strike  Exercises  Exercises Total Joint  Total Joint Exercises  Ankle Circles/Pumps AROM;Both;15 reps  Quad Sets AROM;Both;15 reps  Gluteal Sets AROM;Both;15 reps  Towel Squeeze AROM;Both;15 reps  Short Arc Quad AROM;Right;15 reps  Heel Slides AROM;Right;15 reps  Hip ABduction/ADduction AROM;Right;15 reps  Long Arc Quad  AROM;Right;Seated;15 reps  Other Exercises  Other Exercises all exercises within precautions  PT - End of Session  Activity Tolerance Patient tolerated treatment well  Patient left in chair;with call bell/phone within reach  PT - Assessment/Plan  PT Plan Current plan remains appropriate  PT Frequency 7X/week  Follow Up Recommendations SNF  PT equipment None recommended by PT  PT Goal Progression  Progress towards PT goals Progressing toward goals  PT General Charges  $$ ACUTE PT VISIT 1 Procedure  PT Treatments  $Gait Training 8-22 mins  $Therapeutic Exercise 8-22 mins   Carmelia Bake, PT, DPT 04/18/2014 Pager: [phone]

## 2014-04-18 NOTE — Discharge Summary (Signed)
Physician Discharge Summary  Patient ID: Veronica Warren MRN: [id number] DOB/AGE: 1951/10/01 63 y.o.  Admit date: 04/16/2014 Discharge date:  04/18/2014  Procedures:  Procedure(s) (LRB): TOTAL HIP REVISION ARTHOPLASTY (Right)  Attending Physician:  Dr. 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 Hip pain, right   . Hyperlipidemia   . Arthritis     RHEUMATOID AND OSTEO  ARTHRITIS  . Chronic kidney disease     STAGE III KIDNEY DISEASE    HPI: Veronica Warren, 63 y.o. female, presented for second opinion evaluation of her right hip. She had history of right total hip replacement performed in 2008 with a [employer] metal on metal hip per Dr. Ronny Flurry at [facility] in [place]. She has had some progressive and persistent discomfort in the right hip over the past couple years and has had her hip worked up extensively for metal ion issue as well as hip aspiration to rule out infection. She is here to discuss future surgical intervention. She had been seen at [facility] with a plan for her to have revision hip surgery. She presents today with second opinion information as well as labwork. She walks with only a mild limp. She does not have an assist device. She tolerates hip range of motion well with some persistent discomfort with some pain with hip flexion and internal rotation. She has no problems with her left hip currently. She is otherwise neurovascularly intact. No signs of any cellulitic changes. review her labs including a sed rate of 9 and a C-reactive protein that was elevated at 15 slightly above the range at 0-6. Her cobalt level was noted to be 33. Chromium level was just about 7. Based on this asymmetry she most likely has had some corrosion and debris that has resulted in inflammation in  the hip joint. Her work up has apparently been negative for infection with hip aspiration per her reports. Recommend she consider having it done as she had already heard that it would eliminate the concern she has about metal ion issues inside the hip. She will need to have her acetabular component removed and revised to better orient it and then convert it over to a ceramic on polyethylene insert based on her age and activity level. Risks, benefits and expectations were discussed with the patient. Risks including but not limited to the risk of anesthesia, blood clots, nerve damage, blood vessel damage, failure of the prosthesis, infection and up to and including death. Patient understand the risks, benefits and expectations and wishes to proceed with surgery.  PCP: No primary provider on file.   Discharged Condition: good  Hospital Course:  Patient underwent the above stated procedure on 04/16/2014. Patient tolerated the procedure well and brought to the recovery room in good condition and subsequently to the floor.  POD #1 BP: 98/63 ; Pulse: 64 ; Temp: 97.5 F (36.4 C) ; Resp: 18  Patient reports pain as mild, pain controlled. No events throughout the night. Denies any CP or SOB. Neurovascular intact, dorsiflexion/plantar flexion intact, incision: dressing C/D/I, no cellulitis present and compartment soft.   LABS  Basename    HGB  12.1  HCT  37.4   POD #2  BP: 102/60 ; Pulse: 74 ; Temp: 97.9 F (36.6 C) ;  Resp: 16  Patient reports pain as mild, pain controlled. No events throughout the night. Feels that she is working well with PT. Ready to be discharged to skilled nursing facility. Neurovascular intact, dorsiflexion/plantar flexion intact, incision: dressing C/D/I, no cellulitis present and compartment soft.   LABS  Basename    HGB  11.3  HCT  34.7    Discharge Exam: General appearance: alert, cooperative and no distress Extremities: Homans sign is negative, no sign of DVT, no edema,  redness or tenderness in the calves or thighs and no ulcers, gangrene or trophic changes  Disposition:      Skilled nursing facility with follow up in 2 weeks   Follow-up Information   Follow up with Mauri Pole, MD. Schedule an appointment as soon as possible for a visit in 2 weeks.   Specialty:  Orthopedic Surgery   Contact information:   [address] [place] [address] [phone]       Discharge Instructions   Call MD / Call 911    Complete by:  As directed   If you experience chest pain or shortness of breath, CALL 911 and be transported to the hospital emergency room.  If you develope a fever above 101 F, pus (white drainage) or increased drainage or redness at the wound, or calf pain, call your surgeon's office.     Change dressing    Complete by:  As directed   Maintain surgical dressing for 10-14 days, or until follow up in the clinic.     Constipation Prevention    Complete by:  As directed   Drink plenty of fluids.  Prune juice may be helpful.  You may use a stool softener, such as Colace (over the counter) 100 mg twice a day.  Use MiraLax (over the counter) for constipation as needed.     Diet - low sodium heart healthy    Complete by:  As directed      Discharge instructions    Complete by:  As directed   Maintain surgical dressing for 10-14 days, or until follow up in the clinic. Follow up in 2 weeks at [facility]. Call with any questions or concerns.     Partial weight bearing    Complete by:  As directed   % Body Weight:  50  Laterality:  right  Extremity:  Lower     TED hose    Complete by:  As directed   Use stockings (TED hose) for 2 weeks on both leg(s).  You may remove them at night for sleeping.             Medication List    STOP taking these medications       traMADol 50 MG tablet  Commonly known as:  ULTRAM      TAKE these medications       aspirin 325 MG EC tablet  Take 1 tablet (325 mg total) by  mouth 2 (two) times daily.     DSS 100 MG Caps  Take 100 mg by mouth 2 (two) times daily.     ferrous sulfate 325 (65 FE) MG tablet  Take 1 tablet (325 mg total) by mouth 3 (three) times daily after meals.     methocarbamol 500 MG tablet  Commonly known as:  ROBAXIN  Take 1 tablet (500 mg total) by mouth every 6 (six) hours as needed for muscle spasms.     oxyCODONE 5 MG immediate release tablet  Commonly  known as:  Oxy IR/ROXICODONE  Take 1-3 tablets (5-15 mg total) by mouth every 4 (four) hours.     polyethylene glycol packet  Commonly known as:  MIRALAX / GLYCOLAX  Take 17 g by mouth 2 (two) times daily.     predniSONE 5 MG Tabs tablet  Commonly known as:  STERAPRED UNI-PAK  Take 5 mg by mouth taper from 4 doses each day to 1 dose and stop.         Signed: [place] Pugh. Vasilia Dise   PA-C  04/18/2014, 8:29 AM

## 2014-04-18 NOTE — Progress Notes (Signed)
OT Cancellation Note  Patient Details Name: Veronica Warren MRN: [id number] DOB: [birth year]   Cancelled Treatment:    Reason Eval/Treat Not Completed: Other (comment). Will defer OT eval to SNF. Spoke to Education officer, museum who is aware.  Alycia Patten Maxwell Lemen [phone] 04/18/2014, 9:15 AM

## 2014-04-18 NOTE — Clinical Social Work Note (Signed)
Patient for d/c today to SNF bed at [facility] ([place] rec'd).  Patient pleased and agreeable to this plan- hopeful for a short stay at SNF and then home with husband as pta. Husband to transport via car to SNF. Eduard Clos, MSW, [place]

## 2014-04-19 ENCOUNTER — Non-Acute Institutional Stay (SKILLED_NURSING_FACILITY): Payer: PRIVATE HEALTH INSURANCE | Admitting: Internal Medicine

## 2014-04-19 DIAGNOSIS — N183 Chronic kidney disease, stage 3 unspecified: Secondary | ICD-10-CM

## 2014-04-19 DIAGNOSIS — M25551 Pain in right hip: Secondary | ICD-10-CM

## 2014-04-19 DIAGNOSIS — K5900 Constipation, unspecified: Secondary | ICD-10-CM

## 2014-04-19 DIAGNOSIS — M25559 Pain in unspecified hip: Secondary | ICD-10-CM

## 2014-04-19 DIAGNOSIS — D500 Iron deficiency anemia secondary to blood loss (chronic): Secondary | ICD-10-CM

## 2014-04-20 ENCOUNTER — Encounter: Payer: Self-pay | Admitting: *Deleted

## 2014-04-20 DIAGNOSIS — N183 Chronic kidney disease, stage 3 unspecified: Secondary | ICD-10-CM | POA: Insufficient documentation

## 2014-04-20 DIAGNOSIS — M25551 Pain in right hip: Secondary | ICD-10-CM | POA: Insufficient documentation

## 2014-04-20 DIAGNOSIS — K5900 Constipation, unspecified: Secondary | ICD-10-CM | POA: Insufficient documentation

## 2014-04-20 NOTE — Progress Notes (Signed)
HISTORY & PHYSICAL  DATE: 04/19/2014   FACILITY: [facility]  LEVEL OF CARE: SNF (31)  ALLERGIES:  Allergies  Allergen Reactions  . Vicodin [Hydrocodone-Acetaminophen] Anaphylaxis  . Diazepam Other (See Comments)    Pt wake up fighting     CHIEF COMPLAINT:  Manage right hip pain, acute blood loss anemia and chronic kidney disease stage III  HISTORY OF PRESENT ILLNESS: 63 year old Caucasian female admitted to this facility for short-term rehabilitation after recent hospitalization.  RIGHT HIP PAIN: Patient has a history of right total hip replacement in 2008. She has been having progress a medical assistant pain in her right hip over the last 2 years. She was diagnosed with failed right total hip arthroplasty with trunionosis. She underwent right total hip revision arthroplasty and tolerated the procedure well. She denies right hip pain currently.  ANEMIA: The anemia has been stable. The patient denies fatigue, melena or hematochezia. No complications from the medications currently being used. Postoperative the patient suffered acute blood loss. Last hemoglobins are 12.1 and 11.3.  CHRONIC KIDNEY DISEASE: The patient's chronic kidney disease remains stable.  Patient denies increasing lower extremity swelling or confusion. Last BUN and creatinine are: 18/0.95.  PAST MEDICAL HISTORY :  Past Medical History  Diagnosis Date  . Hip pain, right   . Hyperlipidemia   . Arthritis     RHEUMATOID AND OSTEO  ARTHRITIS  . Chronic kidney disease     STAGE III KIDNEY DISEASE    PAST SURGICAL HISTORY: Past Surgical History  Procedure Laterality Date  . Hip surgery      right surg/07/2007  . Colonoscopy    . Breast enhancement surgery      bil  . Tummy tuck    . Cesarean section      2 times  . Total hip revision Right 04/16/2014    Procedure: TOTAL HIP REVISION ARTHOPLASTY;  Surgeon: Mauri Pole, MD;  Location: [facility];  Service: Orthopedics;   Laterality: Right;    SOCIAL HISTORY:  reports that she has been smoking Cigarettes.  She has been smoking about 0.10 packs per day. She has never used smokeless tobacco. She reports that she drinks about 3 ounces of alcohol per week. She reports that she does not use illicit drugs.  FAMILY HISTORY:  Family History  Problem Relation Age of Onset  . Diabetes Father   . Colon cancer Neg Hx   . Colon polyps Neg Hx   . Rectal cancer Neg Hx   . Stomach cancer Neg Hx     CURRENT MEDICATIONS: Reviewed per MAR/see medication list  REVIEW OF SYSTEMS:  See HPI otherwise 14 point ROS is negative.  PHYSICAL EXAMINATION  VS:  See VS section  GENERAL: no acute distress, moderately obese body habitus EYES: conjunctivae normal, sclerae normal, normal eye lids MOUTH/THROAT: lips without lesions,no lesions in the mouth,tongue is without lesions,uvula elevates in midline NECK: supple, trachea midline, no neck masses, no thyroid tenderness, no thyromegaly LYMPHATICS: no LAN in the neck, no supraclavicular LAN RESPIRATORY: breathing is even & unlabored, BS CTAB CARDIAC: RRR, no murmur,no extra heart sounds, no edema GI:  ABDOMEN: abdomen soft, normal BS, no masses, no tenderness  LIVER/SPLEEN: no hepatomegaly, no splenomegaly MUSCULOSKELETAL: HEAD: normal to inspection  EXTREMITIES: LEFT UPPER EXTREMITY: full range of motion, normal strength & tone RIGHT UPPER EXTREMITY:  full range of motion, normal strength & tone LEFT LOWER EXTREMITY:  Moderate range of motion,  normal strength & tone RIGHT LOWER EXTREMITY:  range of motion not tested due to surgery, normal strength & tone PSYCHIATRIC: the patient is alert & oriented to person, affect & behavior appropriate  LABS/RADIOLOGY:  Labs reviewed: Basic Metabolic Panel:  Recent Labs  04/06/14 1130 04/17/14 0456 04/18/14 0425  NA 139 139 141  K 4.2 5.3 4.4  CL 102 105 105  CO2 24 26 27   GLUCOSE 82 147* 139*  BUN 24* 17 18  CREATININE  1.11* 1.04 0.95  CALCIUM 9.2 9.1 9.1   CBC:  Recent Labs  04/06/14 1130 04/17/14 0456 04/18/14 0425  WBC 13.4* 14.3* 18.2*  HGB 14.7 12.1 11.3*  HCT 43.8 37.4 34.7*  MCV 85.4 86.8 87.8  PLT 264 187 184    PORTABLE PELVIS 1-2 VIEWS   COMPARISON:  None.   FINDINGS: Patient is status post right total hip arthroplasty. Subcutaneous and joint air is noted. Femoral stem appears well seated. Obturator rings are intact.   IMPRESSION: Right total hip arthroplasty with expected postoperative findings.   PORTABLE RIGHT HIP - 1 VIEW   COMPARISON:  None.   FINDINGS: Single cross-table lateral view of the right hip shows a right hip arthroplasty. Femoral stem appears well seated.   IMPRESSION: Right hip arthroplasty without complicating feature.    ASSESSMENT/PLAN:  Right hip pain-status post revision arthroplasty. Continue rehabilitation. Acute blood loss anemia-continue iron. Recheck hemoglobin. Chronic kidney disease stage III-renal function normalized Constipation-continue MiraLax Arthritis-denies joint pains Check CBC  I have reviewed patient's medical records received at admission/from hospitalization.  CPT CODE: 47425  Timira Bieda Y Abhijot Straughter, [place] [phone]

## 2014-04-26 ENCOUNTER — Encounter: Payer: Self-pay | Admitting: Adult Health

## 2014-04-26 ENCOUNTER — Non-Acute Institutional Stay (SKILLED_NURSING_FACILITY): Payer: PRIVATE HEALTH INSURANCE | Admitting: Adult Health

## 2014-04-26 DIAGNOSIS — N183 Chronic kidney disease, stage 3 unspecified: Secondary | ICD-10-CM

## 2014-04-26 DIAGNOSIS — D500 Iron deficiency anemia secondary to blood loss (chronic): Secondary | ICD-10-CM

## 2014-04-26 DIAGNOSIS — K5900 Constipation, unspecified: Secondary | ICD-10-CM

## 2014-04-26 DIAGNOSIS — Z96649 Presence of unspecified artificial hip joint: Secondary | ICD-10-CM

## 2014-04-26 NOTE — Progress Notes (Signed)
Patient ID: Veronica Warren, female   DOB: November 29, 1950, 63 y.o.   MRN: [id number]              PROGRESS NOTE  DATE: 04/26/2014   FACILITY: [facility]  LEVEL OF CARE: SNF (31)  Acute Visit  CHIEF COMPLAINT:  Discharge Notes  HISTORY OF PRESENT ILLNESS: This is a 63 year old female who is for discharge home with Home health PT, OT and Nursing. DME: Rolling walker and Bedside commode. She has been admitted to [facility] on 04/18/14 from [facility] with failed right total hip arthroplasty with trunionosis S/P revision. Patient was admitted to this facility for short-term rehabilitation after the patient's recent hospitalization.  Patient has completed SNF rehabilitation and therapy has cleared the patient for discharge.  Reassessment of ongoing problem(s):  ANEMIA: The anemia has been stable. The patient denies fatigue, melena or hematochezia. No complications from the medications currently being used. 6/15 hgb 11.1  CHRONIC KIDNEY DISEASE: The patient's chronic kidney disease remains stable.  Patient denies increasing lower extremity swelling or confusion.  6/15 BUN 18 and creatinine 0.95  CONSTIPATION: The constipation remains stable. No complications from the medications presently being used. Patient denies ongoing constipation, abdominal pain, nausea or vomiting. PAST MEDICAL HISTORY : Reviewed.  No changes/see problem list  CURRENT MEDICATIONS: Reviewed per MAR/see medication list  REVIEW OF SYSTEMS:  GENERAL: no change in appetite, no fatigue, no weight changes, no fever, chills or weakness RESPIRATORY: no cough, SOB, DOE, wheezing, hemoptysis CARDIAC: no chest pain, edema or palpitations GI: no abdominal pain, diarrhea, constipation, heart burn, nausea or vomiting  PHYSICAL EXAMINATION  GENERAL: no acute distress, obese EYES: conjunctivae normal, sclerae normal, normal eye lids NECK: supple, trachea midline, no neck masses, no thyroid tenderness,  no thyromegaly LYMPHATICS: no LAN in the neck, no supraclavicular LAN RESPIRATORY: breathing is even & unlabored, BS CTAB CARDIAC: RRR, no murmur,no extra heart sounds, no edema GI: abdomen soft, normal BS, no masses, no tenderness, no hepatomegaly, no splenomegaly EXTREMITIES: able to move all 4 extremities; ambulates with a walker PSYCHIATRIC: the patient is alert & oriented to person, affect & behavior appropriate  LABS/RADIOLOGY: Labs reviewed: Basic Metabolic Panel:  Recent Labs  04/06/14 1130 04/17/14 0456 04/18/14 0425  NA 139 139 141  K 4.2 5.3 4.4  CL 102 105 105  CO2 24 26 27   GLUCOSE 82 147* 139*  BUN 24* 17 18  CREATININE 1.11* 1.04 0.95  CALCIUM 9.2 9.1 9.1   CBC:  Recent Labs  04/06/14 1130 04/17/14 0456 04/18/14 0425  WBC 13.4* 14.3* 18.2*  HGB 14.7 12.1 11.3*  HCT 43.8 37.4 34.7*  MCV 85.4 86.8 87.8  PLT 264 187 184    ASSESSMENT/PLAN:  Failed right total hip arthroplasty S/P revision - for Home health PT, OT and Nursing Anemia, expected blood loss - stable; continue Ferrous sulfate Chronic kidney disease - renal function normalized Constipation - stable; changed Miralax and Colace PRN   I have filled out patient's discharge paperwork and written prescriptions.  Patient will receive home health PT, OT and Nursing.  DME provided: Rolling walker and Bedside commode  Total discharge time: Greater than 30 minutes  Discharge time involved coordination of the discharge process with Education officer, museum, nursing staff and therapy department. Medical justification for home health services/DME verified.  CPT CODE: 53299  Seth Bake - NP [facility] [phone]

## 2014-08-30 ENCOUNTER — Other Ambulatory Visit: Payer: Self-pay | Admitting: Obstetrics and Gynecology

## 2014-08-31 LAB — CYTOLOGY - PAP

## 2015-10-11 IMAGING — CR DG HIP 1V PORT*R*
1 series · 1 of 1 positions shown · non-contrast
Comparison: None.

CLINICAL DATA: Postop.

EXAM:
PORTABLE RIGHT HIP - 1 VIEW

[lateral l5-s1]
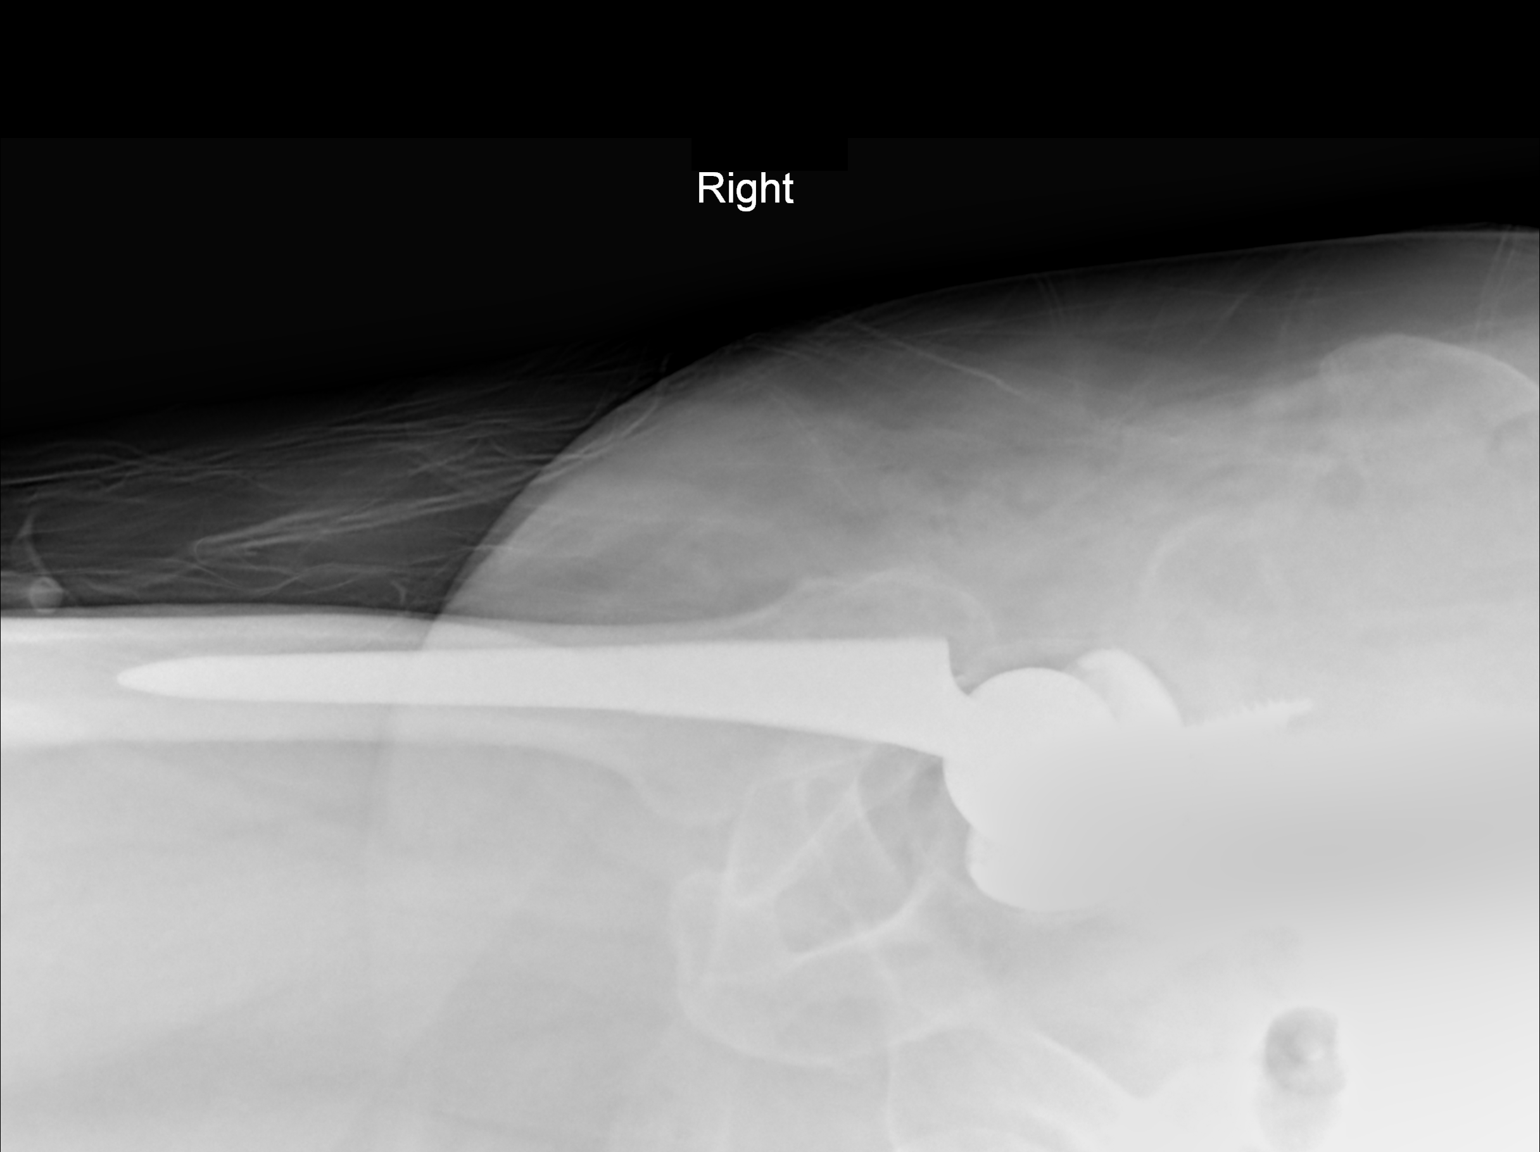

[1 of 1 positions shown; findings below may reference images not displayed]

FINDINGS: Single cross-table lateral view of the right hip shows a right hip
arthroplasty. Femoral stem appears well seated.
IMPRESSION: Right hip arthroplasty without complicating feature.

## 2015-10-11 IMAGING — CR DG PORTABLE PELVIS
1 series · 2 of 2 positions shown · non-contrast
Comparison: None.

CLINICAL DATA: Postop.

EXAM:
PORTABLE PELVIS 1-2 VIEWS

[Series 1: AP · U · 2 of 2 slices shown]
[im 1/2]
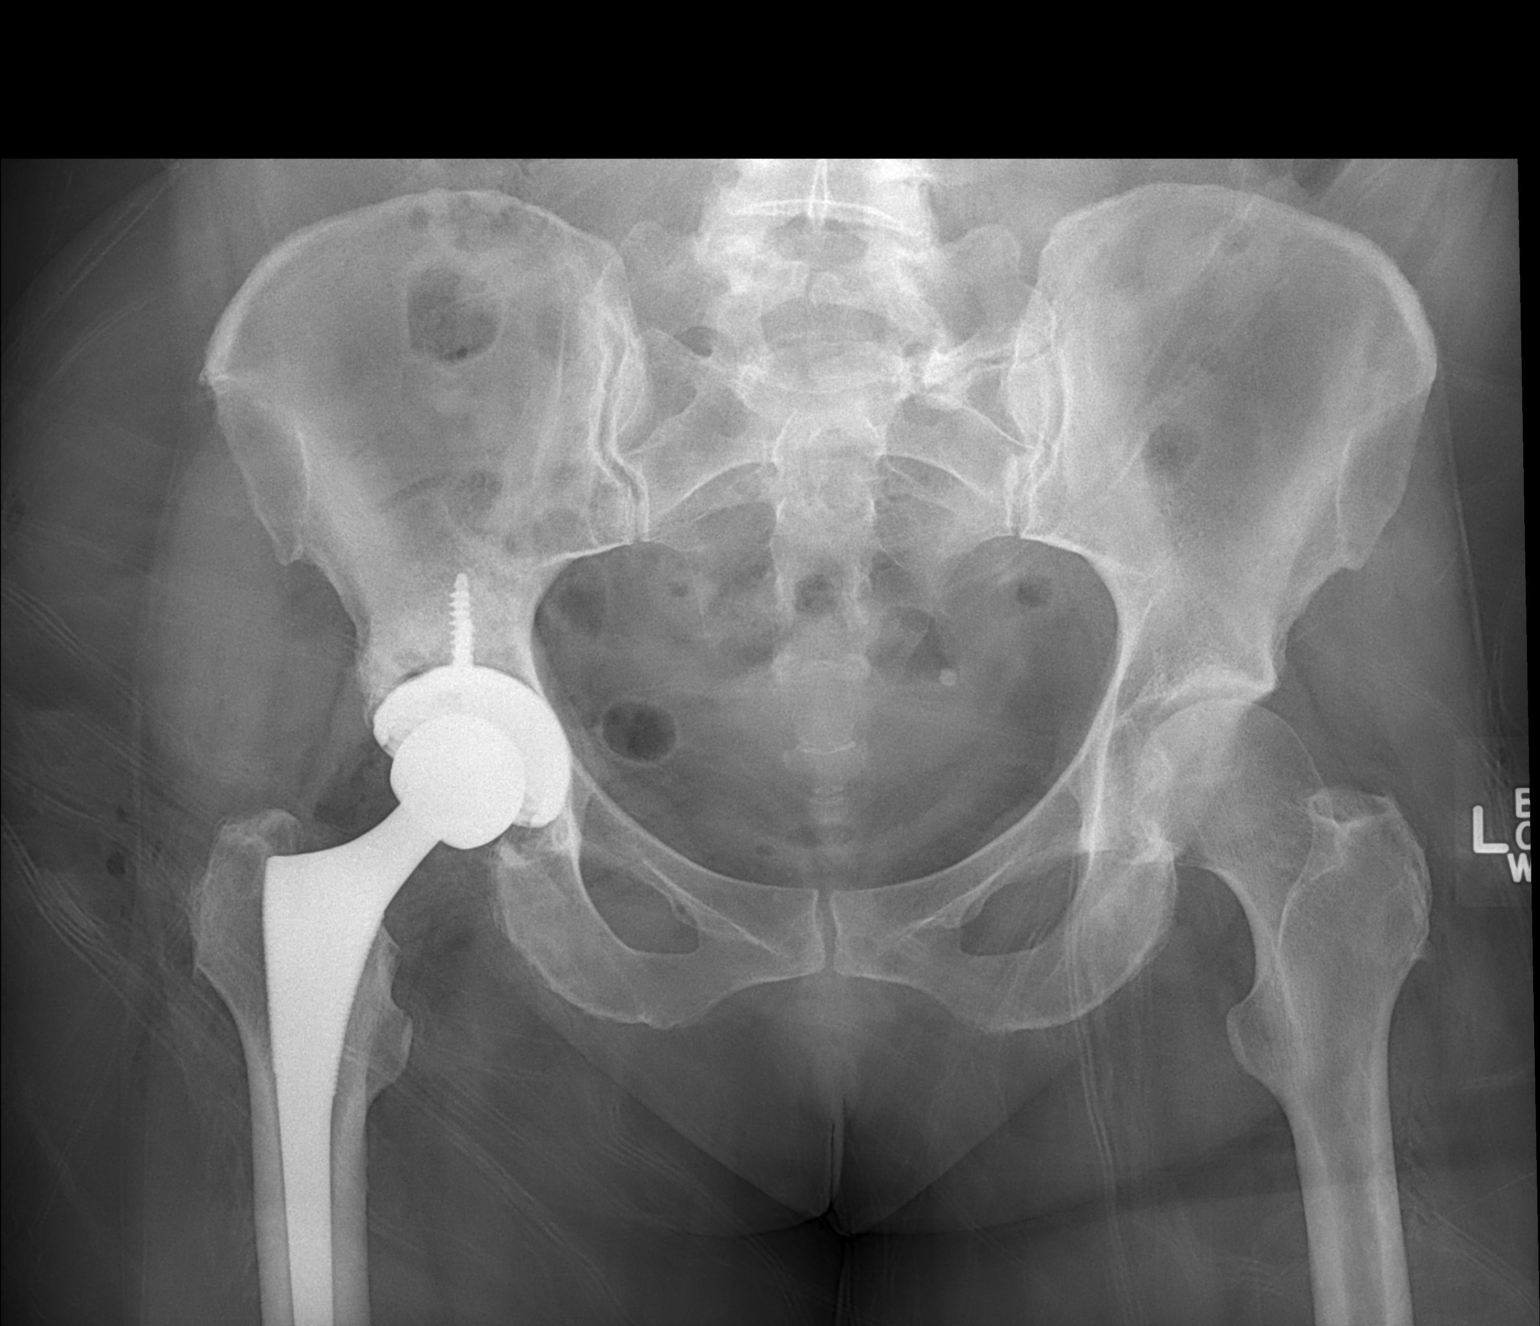
[im 2/2]
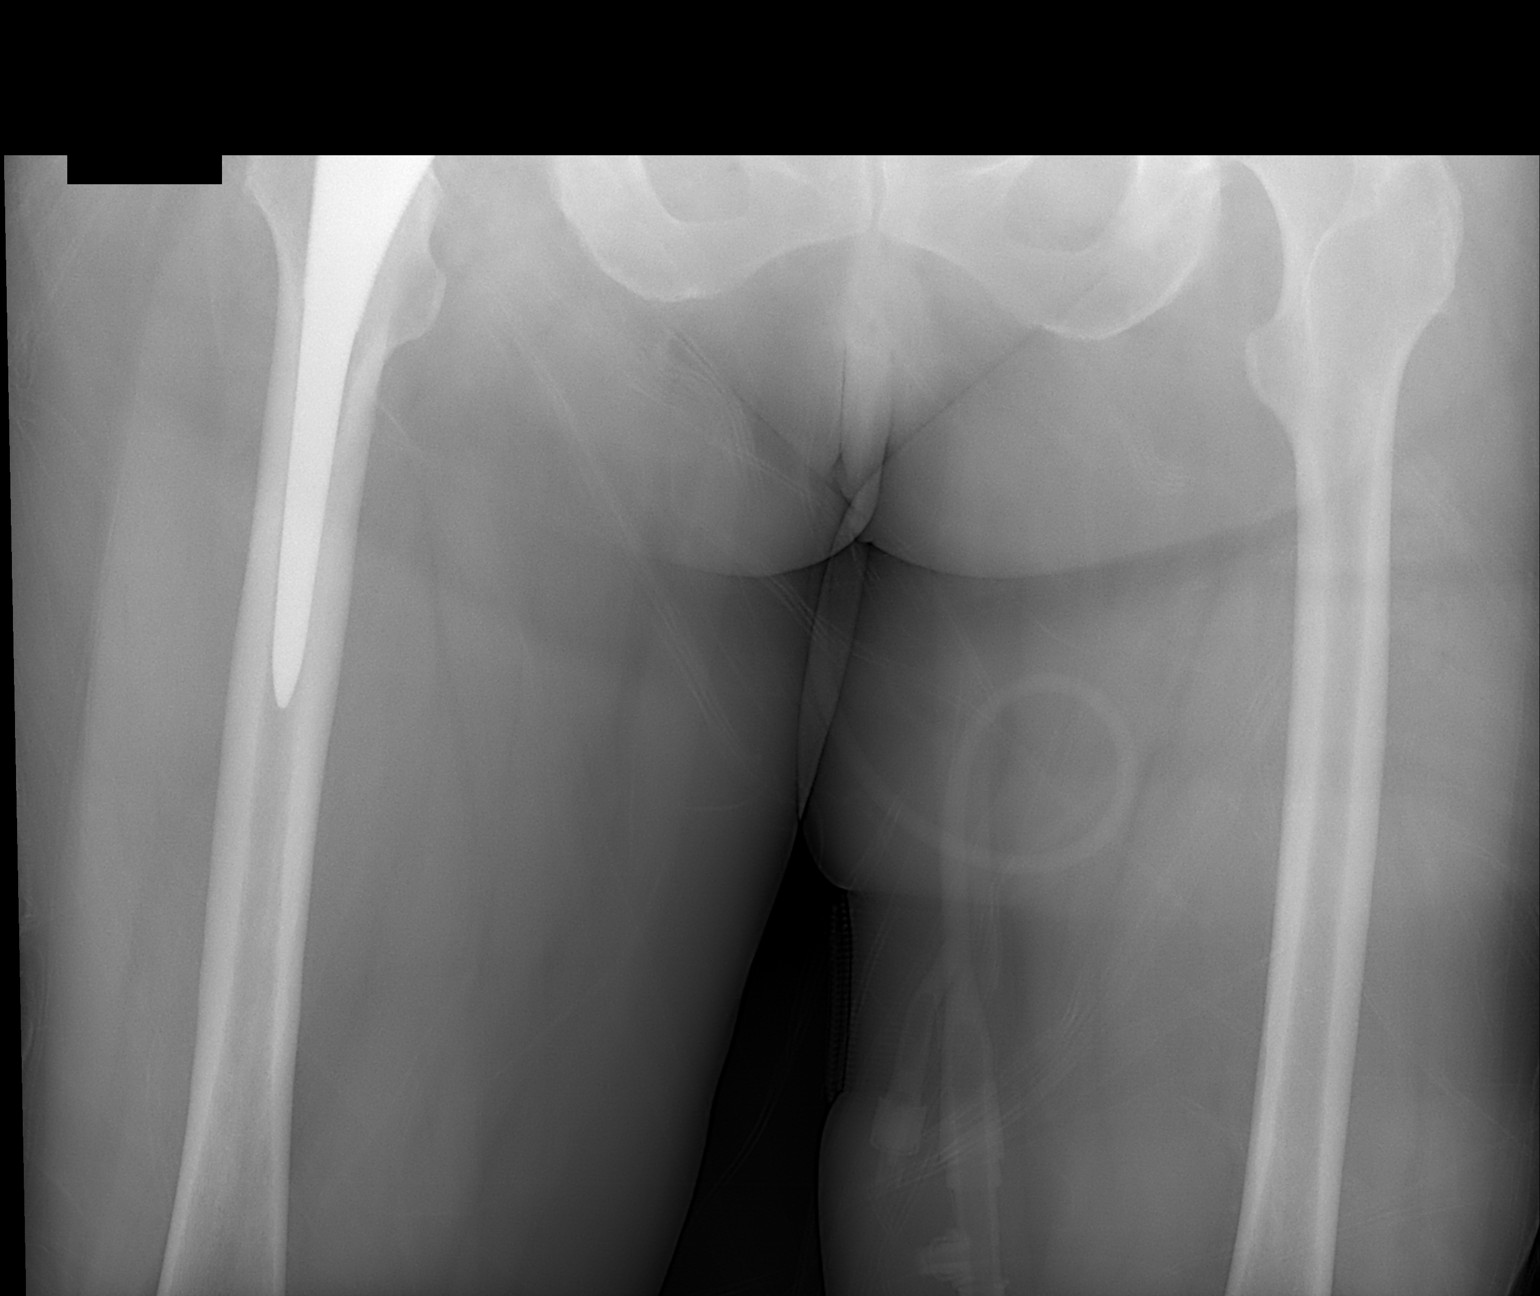

[2 of 2 positions shown; findings below may reference images not displayed]

FINDINGS: Patient is status post right total hip arthroplasty. Subcutaneous
and joint air is noted. Femoral stem appears well seated. Obturator
rings are intact.
IMPRESSION: Right total hip arthroplasty with expected postoperative findings.

## 2018-11-16 HISTORY — PX: SQUAMOUS CELL CARCINOMA EXCISION: SHX2433

## 2019-12-05 ENCOUNTER — Ambulatory Visit: Payer: PRIVATE HEALTH INSURANCE | Attending: Internal Medicine

## 2019-12-05 DIAGNOSIS — Z23 Encounter for immunization: Secondary | ICD-10-CM

## 2019-12-05 NOTE — Progress Notes (Signed)
   Covid-19 Vaccination Clinic  Name:  LATA MEHRING    MRN: [id number] DOB: 11-08-1951  12/05/2019  Ms. Sereno was observed post Covid-19 immunization for 30 minutes based on pre-vaccination screening without incidence. She was provided with Vaccine Information Sheet and instruction to access the V-Safe system.   Ms. Stoltenberg was instructed to call 911 with any severe reactions post vaccine: Marland Kitchen Difficulty breathing  . Swelling of your face and throat  . A fast heartbeat  . A bad rash all over your body  . Dizziness and weakness    Immunizations Administered    Name Date Dose VIS Date Route   Pfizer COVID-19 Vaccine 12/05/2019  3:23 PM 0.3 mL 10/27/2019 Intramuscular   Manufacturer: [place]   Lot: [id number]   [place]: [id number]

## 2019-12-25 ENCOUNTER — Ambulatory Visit: Payer: PRIVATE HEALTH INSURANCE | Attending: Internal Medicine

## 2019-12-25 DIAGNOSIS — Z23 Encounter for immunization: Secondary | ICD-10-CM | POA: Insufficient documentation

## 2019-12-25 NOTE — Progress Notes (Signed)
   Covid-19 Vaccination Clinic  Name:  Veronica Warren    MRN: [id number] DOB: Feb 03, 1951  12/25/2019  [name] was observed post Covid-19 immunization for 15 minutes without incidence. She was provided with Vaccine Information Sheet and instruction to access the V-Safe system.   [name] was instructed to call 911 with any severe reactions post vaccine: Marland Kitchen Difficulty breathing  . Swelling of your face and throat  . A fast heartbeat  . A bad rash all over your body  . Dizziness and weakness    Immunizations Administered    Name Date Dose VIS Date Route   Pfizer COVID-19 Vaccine 12/25/2019  5:23 PM 0.3 mL 10/27/2019 Intramuscular   Manufacturer: [place]   Lot: [id number]   [place]: [id number]

## 2022-05-22 ENCOUNTER — Telehealth: Payer: Self-pay | Admitting: *Deleted

## 2022-05-22 NOTE — Telephone Encounter (Signed)
Attempted to reach the patient to schedule a new patient with Dr Berline Lopes on 7/31

## 2022-05-22 NOTE — Telephone Encounter (Signed)
Spoke with the patient regarding the referral to GYN oncology. Patient scheduled a new patient with Dr Berline Lopes on 7/31 at 11:15 am. Patient given an arrival time of 10:45 am.  Explained to the patient the the doctor will perform a pelvic exam at this visit. Patient given the policy that no visitors under the 16 yrs are a loud in the [place]. Patient given the address/phone number for the clinic and that the center offers free valet service.

## 2022-06-11 ENCOUNTER — Encounter: Payer: Self-pay | Admitting: Gynecologic Oncology

## 2022-06-14 ENCOUNTER — Encounter: Payer: Self-pay | Admitting: Gynecologic Oncology

## 2022-06-14 NOTE — Progress Notes (Unsigned)
GYNECOLOGIC ONCOLOGY NEW PATIENT CONSULTATION   Patient Name: Veronica Warren  Patient Age: 71 y.o. Date of Service: 06/15/22 Referring Provider: Everlene Farrier, [address],  [place] [address]   Primary Care Provider: No primary care provider on file. Consulting Provider: Jeral Pinch, MD   Assessment/Plan:  Postmenopausal patient with high-grade vulvar dysplasia.  We discussed her recent biopsy showing vulvar dysplasia.  We discussed the role of HPV in the pathogenesis of some vulvar dysplasia and cancer.  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  Patient has no history of cervical dysplasia.  Last Pap test was approximately 2 years ago.     With regard to her high-grade vulvar dysplasia, given increased risk of progression to cancer, I recommend proceeding with treatment.  I do not see a visible lesion on her exam today.  Her OB/GYN's note indicates that the lesion was completely biopsied off at the time of her procedure.  The pathology report does not indicate whether the margins were positive or negative.  If margins were positive or close, we discussed options for surgical treatment (excision and laser ablation) as well as topical therapy.  Given very small size of the lesion, I would recommend surgical excision to achieve negative margins with a wide local excision.    We discussed the risk of recurrence after treatment and the need for long-term surveillance with her OBGYN.  In the setting of her rheumatoid arthritis and Morrie Sheldon (can cause immunosuppression), there are data that support that autoimmune diseases can cause increased risk of persistent HPV infection and subsequent HPV-related disease.  Patient also has a history of tobacco use, continues to use currently.  Tobacco use as a cofactor for HPV related dysplasia and malignancy.  Given what I anticipate will be a small resection, I do not think that interruption of her rheumatoid medication  is necessary.  We will reach out to her rheumatologist for any recommendations in the perioperative period.   We discussed the plan for EUA, possible vulvar biopsies, partial simple vulvectomy, and any other indicated procedures. We discussed risks of surgery which included but are not limited to bleeding, need for blood transfusion, infection, damage to surrounding structure, anesthesia risk thromboembolic events, unforeseen complications, and medical complications. We discussed the risk of wound dehiscence given its location. The patient will receive DVT and antibiotic prophylaxis as indicated. She voiced a clear understanding. She had the opportunity to ask questions. Perioperative instructions were reviewed with her. Prescriptions for post-op medications were sent to her pharmacy of choice.  Although the patient has phased out of cervical cancer screening given her age and normal history of Pap smears, given what appears to be HPV related disease, we will plan to perform HPV testing from the cervix at the time of her upcoming procedure.  A copy of this note was sent to the patient's referring provider.   55 minutes of total time was spent for this patient encounter, including preparation, face-to-face counseling with the patient and coordination of care, and documentation of the encounter.   Jeral Pinch, MD  Division of Gynecologic Oncology  Department of Obstetrics and Gynecology  [facility]  ___________________________________________  Chief Complaint: Chief Complaint  Patient presents with   Vulvar intraepithelial neoplasia (VIN) grade 3    History of Present Illness:  Veronica Warren is a 71 y.o. y.o. female who is seen in consultation at the request of Everlene Farrier, MD for an evaluation of  high-grade vulvar dysplasia.  The patient was initially seen at the end of May for an annual exam.  She endorsed noticing a bump on her vulva, unsure how long  it had been there.  On exam, a 1 cm raised, white, crusted lesion on the right buttock was noted. Vulvar biopsy was performed on 05/11/22 revealing VIN3.   Patient reports feeling the vulvar lesion approximately 3 months before her GYN visit.  She denies any symptoms including burning, pain, pruritus, bleeding, or discharge.  She endorses regular bowel and bladder function.  PAST MEDICAL HISTORY:  Past Medical History:  Diagnosis Date   Anxiety    Arthritis    RHEUMATOID AND OSTEO  ARTHRITIS   Chronic kidney disease    STAGE III KIDNEY DISEASE   Hip pain, right    Hyperlipidemia    Osteopenia      PAST SURGICAL HISTORY:  Past Surgical History:  Procedure Laterality Date   BREAST ENHANCEMENT SURGERY     bil   CESAREAN SECTION     2 times   COLONOSCOPY     HIP SURGERY     right surg/07/2007   SQUAMOUS CELL CARCINOMA EXCISION     right forearm   TOTAL HIP REVISION Right 04/16/2014   Procedure: TOTAL HIP REVISION ARTHOPLASTY;  Surgeon: Mauri Pole, MD;  Location: [facility];  Service: Orthopedics;  Laterality: Right;   TUBAL LIGATION     at second c-section   tummy tuck      OB/GYN HISTORY:  OB History  Gravida Para Term Preterm AB Living  2 2          SAB IAB Ectopic Multiple Live Births               # Outcome Date GA Lbr Len/2nd Weight Sex Delivery Anes PTL Lv  2 Para           1 Para             No LMP recorded. Patient is postmenopausal.  Age at menarche: 23  Age at menopause: 66 Hx of HRT: Yes, no current use Hx of STDs: Denies Last pap: 02/2020 History of abnormal pap smears: no  SCREENING STUDIES:  Last mammogram: 2023  Last colonoscopy: 2013  MEDICATIONS: Outpatient Encounter Medications as of 06/15/2022  Medication Sig   pravastatin (PRAVACHOL) 40 MG tablet SMARTSIG:1 Tablet(s) By Mouth Every Evening   XELJANZ XR 11 MG TB24 Take 1 tablet by mouth daily.   [DISCONTINUED] docusate sodium 100 MG CAPS Take 100 mg by mouth 2 (two) times daily.    [DISCONTINUED] ferrous sulfate 325 (65 FE) MG tablet Take 1 tablet (325 mg total) by mouth 3 (three) times daily after meals.   [DISCONTINUED] methocarbamol (ROBAXIN) 500 MG tablet Take 1 tablet (500 mg total) by mouth every 6 (six) hours as needed for muscle spasms.   [DISCONTINUED] oxyCODONE (OXY IR/ROXICODONE) 5 MG immediate release tablet Take 1-3 tablets (5-15 mg total) by mouth every 4 (four) hours.   [DISCONTINUED] polyethylene glycol (MIRALAX / GLYCOLAX) packet Take 17 g by mouth 2 (two) times daily.   [DISCONTINUED] predniSONE (STERAPRED UNI-PAK) 5 MG TABS tablet Take 5 mg by mouth taper from 4 doses each day to 1 dose and stop.   No facility-administered encounter medications on file as of 06/15/2022.    ALLERGIES:  Allergies  Allergen Reactions   Vicodin [Hydrocodone-Acetaminophen] Anaphylaxis   Diazepam Other (See Comments)    Pt wake up fighting  FAMILY HISTORY:  Family History  Problem Relation Age of Onset   Hypertension Mother    Rheum arthritis Mother    Lung cancer Mother 19       died of other causes at 57yo   Diabetes Father    Breast cancer Maternal Aunt    Colon cancer Neg Hx    Colon polyps Neg Hx    Rectal cancer Neg Hx    Stomach cancer Neg Hx    Ovarian cancer Neg Hx    Endometrial cancer Neg Hx    Pancreatic cancer Neg Hx    Prostate cancer Neg Hx      SOCIAL HISTORY:  Social Connections: Not on file    REVIEW OF SYSTEMS:  Denies appetite changes, fevers, chills, fatigue, unexplained weight changes. Denies hearing loss, neck lumps or masses, mouth sores, ringing in ears or voice changes. Denies cough or wheezing.  Denies shortness of breath. Denies chest pain or palpitations. Denies leg swelling. Denies abdominal distention, pain, blood in stools, constipation, diarrhea, nausea, vomiting, or early satiety. Denies pain with intercourse, dysuria, frequency, hematuria or incontinence. Denies hot flashes, pelvic pain, vaginal bleeding or  vaginal discharge.   Denies joint pain, back pain or muscle pain/cramps. Denies itching, rash, or wounds. Denies dizziness, headaches, numbness or seizures. Denies swollen lymph nodes or glands, denies easy bruising or bleeding. Denies anxiety, depression, confusion, or decreased concentration.  Physical Exam:  Vital Signs for this encounter:  Blood pressure (!) 146/69, pulse 95, temperature 98.5 F (36.9 C), resp. rate 18, weight 206 lb 6 oz (93.6 kg), SpO2 96 %. Body mass index is 36.56 kg/m. General: Alert, oriented, no acute distress.  HEENT: Normocephalic, atraumatic. Sclera anicteric.  Chest: Clear to auscultation bilaterally. No wheezes, rhonchi, or rales. Cardiovascular: Regular rate and rhythm, no murmurs, rubs, or gallops.  Abdomen: Obese. Normoactive bowel sounds. Soft, nondistended, nontender to palpation. No masses or hepatosplenomegaly appreciated. No palpable fluid wave.  Extremities: Grossly normal range of motion. Warm, well perfused. No edema bilaterally.  Skin: No rashes or lesions.  Lymphatics: No cervical, supraclavicular, or inguinal adenopathy.  GU:  Normal external female genitalia. No lesions. No discharge or bleeding.  Difficult to appreciate prior biopsy site.  There is some minimal hyperpigmentation along the right buttock, where the patient points to having felt the lesion.  No leukoplakia or visible lesions noted.             Bladder/urethra:  No lesions or masses.             Vagina: Mildly atrophic, no lesions noted.             Cervix: Normal appearing, no lesions.  Atrophic.             Uterus: Small, mobile, no parametrial involvement or nodularity.             Adnexa: No masses appreciated.  Rectal: Deferred.  LABORATORY AND RADIOLOGIC DATA:  Outside medical records were reviewed to synthesize the above history, along with the history and physical obtained during the visit.

## 2022-06-15 ENCOUNTER — Encounter: Payer: Self-pay | Admitting: Gynecologic Oncology

## 2022-06-15 ENCOUNTER — Inpatient Hospital Stay: Payer: PRIVATE HEALTH INSURANCE | Attending: Gynecologic Oncology | Admitting: Gynecologic Oncology

## 2022-06-15 ENCOUNTER — Telehealth: Payer: Self-pay

## 2022-06-15 ENCOUNTER — Inpatient Hospital Stay (HOSPITAL_BASED_OUTPATIENT_CLINIC_OR_DEPARTMENT_OTHER): Payer: PRIVATE HEALTH INSURANCE | Admitting: Gynecologic Oncology

## 2022-06-15 ENCOUNTER — Other Ambulatory Visit: Payer: Self-pay

## 2022-06-15 VITALS — BP 146/69 | HR 95 | Temp 98.5°F | Resp 18 | Wt 206.4 lb

## 2022-06-15 DIAGNOSIS — F17210 Nicotine dependence, cigarettes, uncomplicated: Secondary | ICD-10-CM | POA: Diagnosis not present

## 2022-06-15 DIAGNOSIS — D071 Carcinoma in situ of vulva: Secondary | ICD-10-CM | POA: Diagnosis not present

## 2022-06-15 DIAGNOSIS — B977 Papillomavirus as the cause of diseases classified elsewhere: Secondary | ICD-10-CM

## 2022-06-15 DIAGNOSIS — F17200 Nicotine dependence, unspecified, uncomplicated: Secondary | ICD-10-CM

## 2022-06-15 DIAGNOSIS — I011 Acute rheumatic endocarditis: Secondary | ICD-10-CM

## 2022-06-15 NOTE — Progress Notes (Signed)
Patient here for new patient consultation with Dr. Jeral Pinch and for a pre-operative discussion prior to her scheduled surgery on June 24, 2022. She is scheduled for WLE vulva. The surgery was discussed in detail.  See after visit summary for additional details.    Discussed post-op pain management in detail including the aspects of the enhanced recovery pathway.  Advised her that a new prescription would be sent in prior to surgery if still recommended and it is only to be used for after her upcoming surgery.  We discussed the use of tylenol post-op and to monitor for a maximum of 4,000 mg in a 24 hour period.  Also will plan to prescribe sennakot to be used after surgery and to hold if having loose stools.  Discussed bowel regimen in detail.     Discussed the use of SCDs and measures to take at home to prevent DVT including frequent mobility.  Reportable signs and symptoms of DVT discussed. Post-operative instructions discussed and expectations for after surgery. Incisional care discussed as well including reportable signs and symptoms including erythema, drainage, wound separation.     5 minutes spent with the patient.  Verbalizing understanding of material discussed. No needs or concerns voiced at the end of the visit.   Advised patient to call for any needs.      This appointment is included in the global surgical bundle as pre-operative teaching and has no charge.     She is aware we are reaching out to pathology to check margin status on recent vulvar biopsy with her GYN. She is aware based on this information the above plan for surgery may change.

## 2022-06-15 NOTE — Patient Instructions (Addendum)
We are contacting pathology to obtain additional information about the margins from your recent biopsy. We can tentatively schedule your procedure below with the knowledge there may be a chance that it is not necessary and can always be cancelled.   You do not need to stop taking your xeljanz before surgery.  Preparing for your Surgery  Plan for surgery on June 24, 2022 with Dr. Jeral Pinch at [facility]. You will be scheduled for wide local excision of the vulva.   Pre-operative Testing -You will receive a phone call from presurgical testing at [facility] to discuss surgery instructions and arrange for lab work if needed.  -Bring your insurance card, copy of an advanced directive if applicable, medication list.  -You should not be taking blood thinners or aspirin at least ten days prior to surgery unless instructed by your surgeon.  -Do not take supplements such as fish oil (omega 3), red yeast rice, turmeric before your surgery. You want to avoid medications with aspirin in them including headache powders such as BC or Goody's), Excedrin migraine.  Day Before Surgery at [facility] will be advised you can have clear liquids up until 3 hours before your surgery.    Your role in recovery Your role is to become active as soon as directed by your doctor, while still giving yourself time to heal.  Rest when you feel tired. You will be asked to do the following in order to speed your recovery:  - Cough and breathe deeply. This helps to clear and expand your lungs and can prevent pneumonia after surgery.  - [place]. Do mild physical activity. Walking or moving your legs help your circulation and body functions return to normal. Do not try to get up or walk alone the first time after surgery.   -If you develop swelling on one leg or the other, pain in the back of your leg, redness/warmth in one of your legs, please call the  office or go to the Emergency Room to have a doppler to rule out a blood clot. For shortness of breath, chest pain-seek care in the Emergency Room as soon as possible. - Actively manage your pain. Managing your pain lets you move in comfort. We will ask you to rate your pain on a scale of zero to 10. It is your responsibility to tell your doctor or nurse where and how much you hurt so your pain can be treated.  Special Considerations -Your final pathology results from surgery should be available around one week after surgery and the results will be relayed to you when available.  -FMLA forms can be faxed to [phone] and please allow 5-7 business days for completion.  Pain Management After Surgery -You will be prescribed your pain medication and bowel regimen medications before surgery so that you can have these available when you are discharged from the hospital. The pain medication is for use ONLY AFTER surgery and a new prescription will not be given.   -Make sure that you have Tylenol and Ibuprofen at home IF [place] to use on a regular basis after surgery for pain control. We recommend alternating the medications every hour to six hours since they work differently and are processed in the body differently for pain relief.  -Review the attached handout on narcotic use and their risks and side effects.   Bowel Regimen -You will be prescribed Sennakot-S to  take nightly to prevent constipation especially if you are taking the narcotic pain medication intermittently.  It is important to prevent constipation and drink adequate amounts of liquids. You can stop taking this medication when you are not taking pain medication and you are back on your normal bowel routine.  Risks of Surgery Risks of surgery are low but include bleeding, infection, damage to surrounding structures, re-operation, blood clots, and very rarely death.  AFTER SURGERY INSTRUCTIONS  Return  to work:  2-4 weeks if applicable  Activity: 1. Be up and out of the bed during the day.  Take a nap if needed.  You may walk up steps but be careful and use the hand rail.  Stair climbing will tire you more than you think, you may need to stop part way and rest.   2. No lifting or straining for 4 weeks minimum over 10 pounds. No pushing, pulling, straining for 4 weeks.  3. No driving for minimum 24 hours after surgery, can be up to 5 days.  Do not drive if you are taking narcotic pain medicine and make sure that your reaction time has returned.   4. You can shower as soon as the next day after surgery. Shower daily. No tub baths or submerging your body in water until cleared by your surgeon. If you have the soap that was given to you by pre-surgical testing that was used before surgery, you do not need to use it afterwards because this can irritate your incisions.   5. No sexual activity and nothing in the vagina for 4 weeks.  6. You may experience vaginal spotting and discharge after surgery.  The spotting is normal but if you experience heavy bleeding, call our office.  7. Take Tylenol or ibuprofen first for pain if you are able to take these medications and only use narcotic pain medication for severe pain not relieved by the Tylenol or Ibuprofen.  Monitor your Tylenol intake to a max of 4,000 mg in a 24 hour period. You can alternate these medications after surgery.  Diet: 1. Low sodium Heart Healthy Diet is recommended but you are cleared to resume your normal (before surgery) diet after your procedure.  2. It is safe to use a laxative, such as Miralax or Colace, if you have difficulty moving your bowels. You have been prescribed Sennakot at bedtime every evening to keep bowel movements regular and to prevent constipation.    Wound Care: 1. Keep clean and dry.  Shower daily.  Reasons to call the Doctor: Fever - Oral temperature greater than 100.4 degrees Fahrenheit Foul-smelling  vaginal discharge Difficulty urinating Nausea and vomiting Increased pain at the site of the incision that is unrelieved with pain medicine. Difficulty breathing with or without chest pain New calf pain especially if only on one side Sudden, continuing increased vaginal bleeding with or without clots.   Contacts: For questions or concerns you should contact:  Dr. Jeral Pinch at [phone]  Joylene John, NP at [phone]  After Hours: call [phone] and have the GYN Oncologist paged/contacted (after 5 pm or on the weekends).  Messages sent via mychart are for non-urgent matters and are not responded to after hours so for urgent needs, please call the after hours number.

## 2022-06-15 NOTE — Telephone Encounter (Signed)
Spoke with Rosann Auerbach at [facility] [phone]. Per Dr.Tucker requested margins be evaluated. Rosann Auerbach will have the pathologist evaluate the specimen and she will fax over an updated report to [phone]. She has the office number [phone] for any questions.

## 2022-06-16 ENCOUNTER — Other Ambulatory Visit: Payer: Self-pay

## 2022-06-16 ENCOUNTER — Telehealth: Payer: Self-pay | Admitting: *Deleted

## 2022-06-16 ENCOUNTER — Encounter (HOSPITAL_BASED_OUTPATIENT_CLINIC_OR_DEPARTMENT_OTHER): Payer: Self-pay | Admitting: Gynecologic Oncology

## 2022-06-16 NOTE — Progress Notes (Signed)
Spoke w/ via phone for pre-op interview---pt Lab needs dos---none-               Lab results------ COVID test -----patient states asymptomatic no test needed Arrive at -------0164 am 06-24-2022 NPO after MN NO Solid Food.  Clear liquids from MN until---915 am Med rec completed Medications to take morning of surgery -----Morrie Sheldon, omeprazole Diabetic medication -----n/a Patient instructed no nail polish to be worn day of surgery Patient instructed to bring photo id and insurance card day of surgery Patient aware to have Driver (ride ) / caregiver   husband Jenny Reichmann Droz  for 24 hours after surgery  Patient Special Instructions -----none Pre-Op special Istructions -----none Patient verbalized understanding of instructions that were given at this phone interview. Patient denies shortness of breath, chest pain, fever, cough at this phone interview.

## 2022-06-16 NOTE — Telephone Encounter (Signed)
Per Dr Berline Lopes fax surgical optimization form to the patient's rheumatoid office

## 2022-06-17 ENCOUNTER — Telehealth: Payer: Self-pay | Admitting: Gynecologic Oncology

## 2022-06-17 NOTE — Telephone Encounter (Signed)
Called the patient to discuss pathology addendum.  Vulvar excision from clinic with her OB/GYN showed the lateral margins were negative for high-grade dysplasia, just had low-grade dysplasia.  We discussed that low-grade dysplasia does not require surgical treatment and will be followed closely.  Given margins negative for high-grade dysplasia, I do not think that additional excision is necessary.  Patient was very happy with this news.  I will send this message to her OB/GYN with recommendation for surveillance every 6 months in the setting of high-grade vulvar dysplasia given risk of recurrence.  I encouraged the patient to call if she develops any new lesions between visits.  Jeral Pinch MD Gynecologic Oncology

## 2022-06-24 ENCOUNTER — Ambulatory Visit (HOSPITAL_BASED_OUTPATIENT_CLINIC_OR_DEPARTMENT_OTHER)
Admission: RE | Admit: 2022-06-24 | Payer: PRIVATE HEALTH INSURANCE | Source: Home / Self Care | Admitting: Gynecologic Oncology

## 2022-06-24 DIAGNOSIS — Z01818 Encounter for other preprocedural examination: Secondary | ICD-10-CM

## 2022-06-24 DIAGNOSIS — D071 Carcinoma in situ of vulva: Secondary | ICD-10-CM

## 2022-06-24 HISTORY — DX: Gastro-esophageal reflux disease without esophagitis: K21.9

## 2022-06-24 HISTORY — DX: Presence of spectacles and contact lenses: Z97.3

## 2022-06-24 HISTORY — DX: Other complications of anesthesia, initial encounter: T88.59XA

## 2022-06-24 HISTORY — DX: Prediabetes: R73.03

## 2022-06-24 SURGERY — VULVECTOMY
Anesthesia: Choice

## 2022-07-21 ENCOUNTER — Telehealth: Payer: Self-pay

## 2022-07-21 NOTE — Telephone Encounter (Signed)
Pt called stating she received a reminder call about her appointment on Thursday 9/7 with Dr. Berline Lopes. She states she ended up not having surgery and that appointment needs to be cancelled.   I cancelled appointment. Advised pt to call with any concerns in the future.

## 2022-07-23 ENCOUNTER — Inpatient Hospital Stay: Payer: PRIVATE HEALTH INSURANCE | Admitting: Gynecologic Oncology

## 2022-09-01 ENCOUNTER — Encounter: Payer: Self-pay | Admitting: *Deleted

## 2022-09-02 ENCOUNTER — Ambulatory Visit (INDEPENDENT_AMBULATORY_CARE_PROVIDER_SITE_OTHER): Payer: 59 | Admitting: Neurology

## 2022-09-02 ENCOUNTER — Encounter: Payer: Self-pay | Admitting: Neurology

## 2022-09-02 VITALS — BP 140/83 | HR 82 | Ht 62.0 in | Wt 205.0 lb

## 2022-09-02 DIAGNOSIS — Z9189 Other specified personal risk factors, not elsewhere classified: Secondary | ICD-10-CM

## 2022-09-02 DIAGNOSIS — R0683 Snoring: Secondary | ICD-10-CM | POA: Diagnosis not present

## 2022-09-02 DIAGNOSIS — R351 Nocturia: Secondary | ICD-10-CM

## 2022-09-02 DIAGNOSIS — F17200 Nicotine dependence, unspecified, uncomplicated: Secondary | ICD-10-CM

## 2022-09-02 DIAGNOSIS — E669 Obesity, unspecified: Secondary | ICD-10-CM

## 2022-09-02 NOTE — Progress Notes (Signed)
Subjective:    Patient ID: Veronica Warren is a 71 y.o. female.  HPI    Star Age, MD, PhD [facility] [address] P.O. [place], [place] [address]  Dear Veronica Warren,   I saw your patient, Veronica Warren, upon your kind request in my sleep clinic today for initial consultation of her sleep disorder, in particular, concern for underlying obstructive sleep apnea.  The patient is unaccompanied today.  As you know, Ms. Riera is a 71 year old female with an underlying medical history of reflux disease, hyperlipidemia, osteopenia, prediabetes, anxiety, rheumatoid arthritis, s/p right total hip replacement, depression, chronic kidney disease, smoking, and obesity, who reports snoring and sleep disruption. I reviewed your office note from 07/31/2022.  Her Epworth sleepiness score is 5 out of 24, fatigue severity score is 14 out of 63.  She has nocturia about twice per average night, denies recurrent nocturnal or morning headaches, is not aware of any family history of sleep apnea but her mother snored.  Her husband does not sleep in the same bedroom with her due to her loud snoring.  She is working on weight loss but is currently not working actively on smoking cessation.  She smokes a little less than a pack per day.  She is retired, she worked in an Therapist, art.  Bedtime is generally between midnight and 1 AM, rise time around 8 AM.  She drinks caffeine in the form of diet soda, 2 20 ounce bottles per day.  She lives with her husband, she has 2 grown children, a 51-year-old granddaughter and a 95 year old step grandson.  They have no pets in the household.  She does not have a TV in her bedroom.  She sees rheumatology and her orthopedic surgeon is Dr. Alvan Dame.  Her Past Medical History Is Significant For: Past Medical History:  Diagnosis Date   Anxiety    Arthritis    RHEUMATOID   Complication of anesthesia    woke up fighting after valium taken   GERD  (gastroesophageal reflux disease)    History of Chronic kidney disease    STAGE III KIDNEY DISEASE dx 16 yrs qgo no problem with  kidney labs since then   Hyperlipidemia    Osteopenia    Pre-diabetes    Wears glasses     Her Past Surgical History Is Significant For: Past Surgical History:  Procedure Laterality Date   benign tumor removed from left leg     age 18   BREAST ENHANCEMENT SURGERY Bilateral    age 89's   breast replacements removed     30 yrs ago   CESAREAN SECTION     2 times   COLONOSCOPY     LIPOSUCTION     yrs ago   SQUAMOUS CELL CARCINOMA EXCISION  2020   right forearm   tight thr  07/2007   TOTAL HIP REVISION Right 04/16/2014   Procedure: TOTAL HIP REVISION ARTHOPLASTY;  Surgeon: Mauri Pole, MD;  Location: [facility];  Service: Orthopedics;  Laterality: Right;   TUBAL LIGATION     at second c-section   tummy tuck     yrs ago   WISDOM TOOTH EXTRACTION     as teenager    Her Family History Is Significant For: Family History  Problem Relation Age of Onset   Hypertension Mother    Rheum arthritis Mother    Lung cancer Mother 63       died of other causes at 64yo  Diabetes Father    Breast cancer Maternal Aunt    Colon cancer Neg Hx    Colon polyps Neg Hx    Rectal cancer Neg Hx    Stomach cancer Neg Hx    Ovarian cancer Neg Hx    Endometrial cancer Neg Hx    Pancreatic cancer Neg Hx    Prostate cancer Neg Hx    Sleep apnea Neg Hx     Her Social History Is Significant For: Social History   Socioeconomic History   Marital status: Married    Spouse name: Not on file   Number of children: Not on file   Years of education: Not on file   Highest education level: Not on file  Occupational History   Occupation: retired  Tobacco Use   Smoking status: Every Day    Packs/day: 0.50    Years: 40.00    Total pack years: 20.00    Types: Cigarettes   Smokeless tobacco: Never  Vaping Use   Vaping Use: Never used  Substance and Sexual Activity    Alcohol use: Yes    Alcohol/week: 9.0 standard drinks of alcohol    Types: 3 Cans of beer, 6 Shots of liquor per week   Drug use: No   Sexual activity: Yes  Other Topics Concern   Not on file  Social History Narrative   Not on file   Social Determinants of Health   Financial Resource Strain: Not on file  Food Insecurity: Not on file  Transportation Needs: Not on file  Physical Activity: Not on file  Stress: Not on file  Social Connections: Not on file    Her Allergies Are:  Allergies  Allergen Reactions   Vicodin [Hydrocodone-Acetaminophen] Anaphylaxis   Diazepam Other (See Comments)    Pt wake up fighting   :   Her Current Medications Are:  Outpatient Encounter Medications as of 09/02/2022  Medication Sig   buPROPion (WELLBUTRIN XL) 150 MG 24 hr tablet Take 1 tablet by mouth every morning.   omeprazole (PRILOSEC) 20 MG capsule Take 20 mg by mouth every other day.   pravastatin (PRAVACHOL) 40 MG tablet SMARTSIG:1 Tablet(s) By Mouth Every Evening   XELJANZ XR 11 MG TB24 Take 1 tablet by mouth daily.   cyclobenzaprine (FLEXERIL) 5 MG tablet Take 5 mg by mouth 3 (three) times daily as needed for muscle spasms.   naproxen (NAPROSYN) 500 MG tablet Take 500 mg by mouth 2 (two) times daily with a meal.   Semaglutide-Weight Management 0.25 MG/0.5ML SOAJ Inject 0.5 mLs into the skin once a week. (Patient not taking: Reported on 09/02/2022)   No facility-administered encounter medications on file as of 09/02/2022.  :   Review of Systems:  Out of a complete 14 point review of systems, all are reviewed and negative with the exception of these symptoms as listed below:  Review of Systems  Neurological:        Pt here for sleep consult Pt snores Pt denies hypertension,headaches ,fatigue,sleep study,CPAP machine      ESS:5 FSS:14    Objective:  Neurological Exam  Physical Exam Physical Examination:   Vitals:   09/02/22 1341  BP: (!) 140/83  Pulse: 82    General  Examination: The patient is a very pleasant 71 y.o. female in no acute distress. She appears well-developed and well-nourished and well groomed.   HEENT: Normocephalic, atraumatic, pupils are equal, Warren and reactive to light, extraocular tracking is good without limitation to gaze excursion  or nystagmus noted. Hearing is grossly intact. Face is symmetric with normal facial animation. Speech is clear with no dysarthria noted. There is no hypophonia. There is no lip, neck/head, jaw or voice tremor. Neck is supple with full range of passive and active motion. There are no carotid bruits on auscultation. Oropharynx exam reveals: moderate mouth dryness, adequate dental hygiene with bridge on top and moderate airway crowding, due to larger uvula, tonsillar size of about 1+ bilaterally, Mallampati class III.  Neck circumference 15 3/8 inches.  Mild overbite.  Tongue protrudes centrally and palate elevates symmetrically.    Chest: Clear to auscultation without wheezing, rhonchi or crackles noted.  Heart: S1+S2+0, regular and normal without murmurs, rubs or gallops noted.   Abdomen: Soft, non-tender and non-distended.  Extremities: There is no pitting edema in the distal lower extremities bilaterally.   Skin: Warm and dry without trophic changes noted.   Musculoskeletal: exam reveals no obvious joint deformities.   Neurologically:  Mental status: The patient is awake, alert and oriented in all 4 spheres. Her immediate and remote memory, attention, language skills and fund of knowledge are appropriate. There is no evidence of aphasia, agnosia, apraxia or anomia. Speech is clear with normal prosody and enunciation. Thought process is linear. Mood is normal and affect is normal.  Cranial nerves II - XII are as described above under HEENT exam.  Motor exam: Normal bulk, strength and tone is noted. There is no obvious action or resting tremor.  Fine motor skills and coordination: grossly intact.  Cerebellar  testing: No dysmetria or intention tremor. There is no truncal or gait ataxia.  Sensory exam: intact to light touch in the upper and lower extremities.  Gait, station and balance: She stands easily. No veering to one side is noted. No leaning to one side is noted. Posture is age-appropriate and stance is narrow based. Gait shows normal stride length and normal pace. No problems turning are noted.   Assessment and Plan:  In summary, MEEGAN SHANAFELT is a very pleasant 71 y.o.-year old female with an underlying medical history of reflux disease, hyperlipidemia, osteopenia, prediabetes, anxiety, rheumatoid arthritis, s/p right total hip replacement, depression, chronic kidney disease, smoking, and obesity, whose history and physical exam are concerning for sleep disordered breathing, supporting a current working diagnosis of unspecified sleep apnea, with the main differential diagnoses of obstructive sleep apnea (OSA) versus upper airway resistance syndrome (UARS) versus central sleep apnea (CSA), or mixed sleep apnea. A laboratory attended sleep study is considered gold standard for evaluation of sleep disordered breathing and is recommended at this time and clinically justified.   I had a long chat with the patient about my findings and the diagnosis of sleep apnea, particularly OSA, its prognosis and treatment options. We talked about medical/conservative treatments, surgical interventions and non-pharmacological approaches for symptom control. I explained, in particular, the risks and ramifications of untreated moderate to severe OSA, especially with respect to developing cardiovascular disease down the road, including congestive heart failure (CHF), difficult to treat hypertension, cardiac arrhythmias (particularly A-fib), neurovascular complications including TIA, stroke and dementia. Even type 2 diabetes has, in part, been linked to untreated OSA. Symptoms of untreated OSA may include (but may not be  limited to) daytime sleepiness, nocturia (i.e. frequent nighttime urination), memory problems, mood irritability and suboptimally controlled or worsening mood disorder such as depression and/or anxiety, lack of energy, lack of motivation, physical discomfort, as well as recurrent headaches, especially morning or nocturnal headaches. We talked about  the importance of maintaining a healthy lifestyle and striving for healthy weight.  The importance of complete smoking cessation was also addressed.  In addition, we talked about the importance of striving for and maintaining good sleep hygiene. I recommended the following at this time: sleep study.  I outlined the differences between a laboratory attended sleep study which is considered more comprehensive and accurate over the option of a home sleep test (HST); the latter may lead to underestimation of sleep disordered breathing in some instances and does not help with diagnosing upper airway resistance syndrome and is not accurate enough to diagnose primary central sleep apnea typically. I explained the different sleep test procedures to the patient in detail and also outlined possible surgical and non-surgical treatment options of OSA, including the use of a pressure airway pressure (PAP) device (ie CPAP, AutoPAP/APAP or BiPAP in certain circumstances), a custom-made dental device (aka oral appliance, which would require a referral to a specialist dentist or orthodontist typically, and is generally speaking not considered a good choice for patients with full dentures or edentulous state), upper airway surgical options, such as traditional UPPP (which is not considered a first-line treatment) or the Inspire device (hypoglossal nerve stimulator, which would involve a referral for consultation with an ENT surgeon, after careful selection, following inclusion criteria). I explained the PAP treatment option to the patient in detail, as this is generally considered  first-line treatment.  The patient indicated that she would be willing to try PAP therapy, if the need arises. I explained the importance of being compliant with PAP treatment, not only for insurance purposes but primarily to improve patient's symptoms symptoms, and for the patient's long term health benefit, including to reduce Her cardiovascular risks longer-term.    We will pick up our discussion about the next steps and treatment options after testing.  We will keep her posted as to the test results by phone call and/or MyChart messaging where possible.  We will plan to follow-up in sleep clinic accordingly as well.  I answered all her questions today and the patient was in agreement.   I encouraged her to call with any interim questions, concerns, problems or updates or email [place] through [place].  Generally speaking, sleep test authorizations may take up to 2 weeks, sometimes less, sometimes longer, the patient is encouraged to get in touch with [place] if they do not hear back from the sleep lab staff directly within the next 2 weeks.  Thank you very much for allowing me to participate in the care of this nice patient. If I can be of any further assistance to you please do not hesitate to call me at [phone].  Sincerely,   Star Age, MD, PhD

## 2022-09-02 NOTE — Patient Instructions (Signed)

## 2022-09-04 ENCOUNTER — Encounter: Payer: Self-pay | Admitting: Internal Medicine

## 2022-10-30 ENCOUNTER — Encounter: Payer: Self-pay | Admitting: Internal Medicine

## 2022-11-02 ENCOUNTER — Telehealth: Payer: Self-pay | Admitting: Neurology

## 2022-11-02 NOTE — Telephone Encounter (Signed)
10/15/22 Medicare/Cign[employer] no Josem Kaufmann req spoke to Melina Copa ref # [id number]    left VM 10/29/22 [place]

## 2022-11-18 NOTE — Telephone Encounter (Signed)
Patient returned the call.  She chose the HST, she is scheduled at [facility] for 03/02/23 at 2 pm.  She requested an April appointment.   Mailed packet to the patient.

## 2022-12-25 ENCOUNTER — Encounter: Payer: PRIVATE HEALTH INSURANCE | Admitting: Internal Medicine

## 2023-01-19 NOTE — Telephone Encounter (Signed)
Updated Environmental consultant.  HST- Medicare no auth req- Cign[employer] no auth req spoke to Hannah Beat ref # [id number]   Patient is scheduled at [facility] for 03/02/23 at 2 pm.

## 2023-01-28 ENCOUNTER — Encounter: Payer: Self-pay | Admitting: Internal Medicine

## 2023-03-02 ENCOUNTER — Ambulatory Visit: Payer: Managed Care, Other (non HMO) | Admitting: Neurology

## 2023-03-02 DIAGNOSIS — IMO0001 Reserved for inherently not codable concepts without codable children: Secondary | ICD-10-CM

## 2023-03-02 DIAGNOSIS — G4733 Obstructive sleep apnea (adult) (pediatric): Secondary | ICD-10-CM

## 2023-03-02 DIAGNOSIS — R351 Nocturia: Secondary | ICD-10-CM

## 2023-03-02 DIAGNOSIS — G4734 Idiopathic sleep related nonobstructive alveolar hypoventilation: Secondary | ICD-10-CM

## 2023-03-02 DIAGNOSIS — E669 Obesity, unspecified: Secondary | ICD-10-CM

## 2023-03-02 DIAGNOSIS — R0683 Snoring: Secondary | ICD-10-CM

## 2023-03-02 DIAGNOSIS — Z9189 Other specified personal risk factors, not elsewhere classified: Secondary | ICD-10-CM

## 2023-03-02 DIAGNOSIS — F17200 Nicotine dependence, unspecified, uncomplicated: Secondary | ICD-10-CM

## 2023-03-04 NOTE — Progress Notes (Unsigned)
   [facility]  HOME SLEEP TEST (Watch PAT) REPORT  STUDY DATE: 03/03/23  DOB: February 18, 1951  MRN: [id number]  ORDERING CLINICIAN: Huston Foley, MD, PhD   REFERRING CLINICIAN: Misty Stanley, PA  CLINICAL INFORMATION/HISTORY: 72 year old female with an underlying medical history of reflux disease, hyperlipidemia, osteopenia, prediabetes, anxiety, rheumatoid arthritis, s/p right total hip replacement, depression, chronic kidney disease, smoking, and obesity, who reports snoring and sleep disruption.   Epworth sleepiness score: 5/24.  BMI: 37.7 kg/m  FINDINGS:   Sleep Summary:   Total Recording Time (hours, min): *** hours, *** min  Total Sleep Time (hours, min):  *** hours, *** min  Percent REM (%):    ***%   Respiratory Indices:   Calculated pAHI (per hour):  ***/hour         REM pAHI:    ***/hour       NREM pAHI: ***/hour  Central pAHI: ***/hour  Oxygen Saturation Statistics:    Oxygen Saturation (%) Mean: ***%   Minimum oxygen saturation (%):                 ***%   O2 Saturation Range (%): *** - ***%    O2 Saturation (minutes) <=88%: *** min  Pulse Rate Statistics:   Pulse Mean (bpm):    ***/min    Pulse Range (*** - ***/min)   IMPRESSION: OSA (obstructive sleep apnea) *** Primary snoring *** Primary Central Sleep Apnea *** Nocturnal Hypoxemia  RECOMMENDATION:  ***   INTERPRETING PHYSICIAN:   Huston Foley, MD, PhD Medical Director, [facility]) Diplomat, ABPN (Neurology and Sleep)   [facility] [address] [place], [place] [address] [phone]

## 2023-03-08 NOTE — Addendum Note (Signed)
Addended by: Huston Foley on: 03/08/2023 02:43 PM   Modules accepted: Orders

## 2023-03-08 NOTE — Procedures (Signed)
[facility]  HOME SLEEP TEST (Watch PAT) REPORT  STUDY DATE: 03/03/23  DOB: 05-13-1951  MRN: [id number]  ORDERING CLINICIAN: Huston Foley, MD, PhD   REFERRING CLINICIAN: Misty Stanley, PA  CLINICAL INFORMATION/HISTORY: 72 year old female with an underlying medical history of reflux disease, hyperlipidemia, osteopenia, prediabetes, anxiety, rheumatoid arthritis, s/p right total hip replacement, depression, chronic kidney disease, smoking, and obesity, who reports snoring and sleep disruption.   Epworth sleepiness score: 5/24.  BMI: 37.7 kg/m  FINDINGS:   Sleep Summary:   Total Recording Time (hours, min): 7 hours, 33 min  Total Sleep Time (hours, min):  6 hours, 37 min  Percent REM (%):    23.6%   Respiratory Indices:   Calculated pAHI (per hour):  38.6/hour   (utilizing the 4% desaturation criteria for hypopneas per Medicare guidelines)   REM pAHI:    52/hour       NREM pAHI: 34.5/hour  Central pAHI: 2.4/hour  Oxygen Saturation Statistics:    Oxygen Saturation (%) Mean: 91%   Minimum oxygen saturation (%):                 72%   O2 Saturation Range (%): 72 - 97%    O2 Saturation (minutes) <=88%: 26.7 min  Pulse Rate Statistics:   Pulse Mean (bpm):    74/min    Pulse Range (45 - 108/min)   IMPRESSION: OSA (obstructive sleep apnea), severe Nocturnal Hypoxemia  RECOMMENDATION:  This home sleep test demonstrates severe obstructive sleep apnea with a total AHI of 38.6/hour and O2 nadir of 72% with significant time below or at 88% saturation of over 25 minutes, indicating nocturnal hypoxemia.  Snoring was detected, in the mild to moderate range.  Treatment with positive airway pressure is highly recommended. This will require - ideally - a full night CPAP titration study for proper treatment settings, O2 monitoring and mask fitting. For now, the patient will be advised to proceed with an autoPAP titration/trial at home. A laboratory attended  titration study can be considered in the future for optimization of treatment settings and to improve tolerance and compliance. Alternative treatment options are limited secondary to the severity of the patient's sleep disordered breathing, but may include surgical treatment with an implantable hypoglossal nerve stimulator (in carefully selected candidates, meeting criteria).  Concomitant weight loss is recommended, where clinically appropriate. Please note, that untreated obstructive sleep apnea may carry additional perioperative morbidity. Patients with significant obstructive sleep apnea should receive perioperative PAP therapy and the surgeons and particularly the anesthesiologist should be informed of the diagnosis and the severity of the sleep disordered breathing. The patient should be cautioned not to drive, work at heights, or operate dangerous or heavy equipment when tired or sleepy. Review and reiteration of good sleep hygiene measures should be pursued with any patient. Other causes of the patient's symptoms, including circadian rhythm disturbances, an underlying mood disorder, medication effect and/or an underlying medical problem cannot be ruled out based on this test. Clinical correlation is recommended.  The patient and her referring provider will be notified of the test results. The patient will be seen in follow up in sleep clinic at [facility].  I certify that I have reviewed the raw data recording prior to the issuance of this report in accordance with the standards of the American Academy of Sleep Medicine (AASM).  INTERPRETING PHYSICIAN:   Huston Foley, MD, PhD Medical Director, [facility]) Diplomat, ABPN (Neurology and Sleep)   [facility] [address], [address] [place], [place] [address] [phone]

## 2023-03-10 ENCOUNTER — Telehealth: Payer: Self-pay | Admitting: *Deleted

## 2023-03-10 NOTE — Telephone Encounter (Signed)
Spoke to patient gave sleep study results Pt chose [facility]  has her DME informed patient of insurance compliance . Has to wear Autopap 4+ hours nightly and come to all f/u appointments in order for insurance to help pay for Autopap machine and supplies Gave patient initial f/u appointment with Dr.Athar 05/2023 Forward sleep study results to referring MD today Faxed orders to [facility]  this afternoon

## 2023-03-10 NOTE — Telephone Encounter (Signed)
-----   Message from Huston Foley, MD sent at 03/08/2023  2:43 PM EDT ----- Urgent set up requested on PAP therapy, due to severe OSA. Patient referred by Misty Stanley, PA, seen by me on 09/02/2022, patient had a HST on 03/03/2023.    Please call and notify the patient that the recent home sleep test showed obstructive sleep apnea in the severe range. I recommend treatment for this in the form of autoPAP, which means, that we don't have to bring her in for a sleep study with CPAP, but will let her start using a so called autoPAP machine at home, through a DME company (of her choice, or as per insurance requirement). The DME representative will fit the patient with a mask of choice, educate her on how to use the machine, how to put the mask on, etc. I have placed an order in the chart. Please send the order to a local DME, talk to patient, send report to referring MD. Please also reinforce the need for compliance with treatment. We will need a FU in sleep clinic for 10 weeks post-PAP set up, please arrange that with me or one of our NPs. Thanks,   Huston Foley, MD, PhD [facility])

## 2023-03-15 ENCOUNTER — Ambulatory Visit (AMBULATORY_SURGERY_CENTER): Payer: Managed Care, Other (non HMO) | Admitting: *Deleted

## 2023-03-15 VITALS — Ht 62.0 in | Wt 220.0 lb

## 2023-03-15 DIAGNOSIS — Z1211 Encounter for screening for malignant neoplasm of colon: Secondary | ICD-10-CM

## 2023-03-15 MED ORDER — NA SULFATE-K SULFATE-MG SULF 17.5-3.13-1.6 GM/177ML PO SOLN: 1.0000 | Freq: Once | ORAL | 0 refills | Status: AC

## 2023-03-15 NOTE — Progress Notes (Signed)
No egg or soy allergy known to patient  No issues known to pt with past sedation with any surgeries or procedures Patient denies ever being told they had issues or difficulty with intubation  No FH of Malignant Hyperthermia Pt is not on diet pills Pt is not on  home 02  Pt is not on blood thinners  Pt denies issues with constipation  No A fib or A flutter Have any cardiac testing pending--NO Pt instructed to use Singlecare.com or GoodRx for a Gillen reduction on prep    INDEPENDENT WITH MOBILITY  Patient's chart reviewed by John Nulty CNRA prior to previsit and patient appropriate for the [facility].  Previsit completed and red dot placed by patient's name on their procedure day (on provider's schedule).    

## 2023-03-16 ENCOUNTER — Encounter: Payer: Self-pay | Admitting: Internal Medicine

## 2023-04-06 ENCOUNTER — Encounter: Payer: Self-pay | Admitting: Internal Medicine

## 2023-04-06 ENCOUNTER — Ambulatory Visit: Payer: PRIVATE HEALTH INSURANCE | Admitting: Internal Medicine

## 2023-04-06 VITALS — BP 112/58 | HR 84 | Temp 98.0°F | Resp 23 | Ht 62.0 in | Wt 220.0 lb

## 2023-04-06 DIAGNOSIS — K635 Polyp of colon: Secondary | ICD-10-CM | POA: Diagnosis not present

## 2023-04-06 DIAGNOSIS — Z1211 Encounter for screening for malignant neoplasm of colon: Secondary | ICD-10-CM | POA: Diagnosis present

## 2023-04-06 DIAGNOSIS — D123 Benign neoplasm of transverse colon: Secondary | ICD-10-CM | POA: Diagnosis not present

## 2023-04-06 DIAGNOSIS — D120 Benign neoplasm of cecum: Secondary | ICD-10-CM | POA: Diagnosis not present

## 2023-04-06 DIAGNOSIS — D124 Benign neoplasm of descending colon: Secondary | ICD-10-CM

## 2023-04-06 MED ORDER — SODIUM CHLORIDE 0.9 % IV SOLN: 500.0000 mL | INTRAVENOUS | Status: DC

## 2023-04-06 NOTE — Progress Notes (Signed)
GASTROENTEROLOGY PROCEDURE H&P NOTE   Primary Care Physician: Pcp, No    Reason for Procedure:   Colon cancer screening  Plan:    Colonoscopy  Patient is appropriate for endoscopic procedure(s) in the ambulatory ([facility]) setting.  The nature of the procedure, as well as the risks, benefits, and alternatives were carefully and thoroughly reviewed with the patient. Ample time for discussion and questions allowed. The patient understood, was satisfied, and agreed to proceed.     HPI: Veronica Warren is a 72 y.o. female who presents for colonoscopy for colon cancer screening. Denies blood in stools, changes in bowel habits, or unintentional weight loss. Denies family history of colon cancer.  Past Medical History:  Diagnosis Date   Anxiety    Arthritis    RHEUMATOID   Complication of anesthesia    woke up fighting after valium taken   GERD (gastroesophageal reflux disease)    History of Chronic kidney disease    STAGE III KIDNEY DISEASE dx 15 yrs qgo no problem with  kidney labs since then   Hyperlipidemia    Osteopenia    Pre-diabetes    Sleep apnea    Wears glasses     Past Surgical History:  Procedure Laterality Date   benign tumor removed from left leg     age 43   BREAST ENHANCEMENT SURGERY Bilateral    age 65's   breast replacements removed     30 yrs ago   CESAREAN SECTION     2 times   COLONOSCOPY     LIPOSUCTION     yrs ago   SQUAMOUS CELL CARCINOMA EXCISION  2020   right forearm   tight thr  07/2007   TOTAL HIP REVISION Right 04/16/2014   Procedure: TOTAL HIP REVISION ARTHOPLASTY;  Surgeon: Shelda Pal, MD;  Location: [facility];  Service: Orthopedics;  Laterality: Right;   TUBAL LIGATION     at second c-section   tummy tuck     yrs ago   WISDOM TOOTH EXTRACTION     as teenager    Prior to Admission medications   Medication Sig Start Date End Date Taking? Authorizing Provider  buPROPion (WELLBUTRIN XL) 150 MG 24 hr tablet Take 1 tablet by mouth  every morning. 07/31/22   [provider]  cyclobenzaprine (FLEXERIL) 5 MG tablet Take 5 mg by mouth 3 (three) times daily as needed for muscle spasms.    [provider]  naproxen (NAPROSYN) 500 MG tablet Take 500 mg by mouth 2 (two) times daily with a meal. 07/31/22   [provider]  omeprazole (PRILOSEC) 20 MG capsule Take 20 mg by mouth every other day.    [provider]  Semaglutide-Weight Management 0.25 MG/0.5ML SOAJ Inject 0.5 mLs into the skin once a week. Patient not taking: Reported on 09/02/2022 07/31/22   [provider]  simvastatin (ZOCOR) 40 MG tablet 1 tablet in the evening Orally Once a day 09/08/22   [provider]  XELJANZ XR 11 MG TB24 Take 1 tablet by mouth daily. 04/23/22   [provider]    Current Outpatient Medications  Medication Sig Dispense Refill   buPROPion (WELLBUTRIN XL) 150 MG 24 hr tablet Take 1 tablet by mouth every morning.     cyclobenzaprine (FLEXERIL) 5 MG tablet Take 5 mg by mouth 3 (three) times daily as needed for muscle spasms.     naproxen (NAPROSYN) 500 MG tablet Take 500 mg by mouth 2 (two)  times daily with a meal.     omeprazole (PRILOSEC) 20 MG capsule Take 20 mg by mouth every other day.     Semaglutide-Weight Management 0.25 MG/0.5ML SOAJ Inject 0.5 mLs into the skin once a week. (Patient not taking: Reported on 09/02/2022)     simvastatin (ZOCOR) 40 MG tablet 1 tablet in the evening Orally Once a day     XELJANZ XR 11 MG TB24 Take 1 tablet by mouth daily.     Current Facility-Administered Medications  Medication Dose Route Frequency Provider Last Rate Last Admin   0.9 %  sodium chloride infusion  500 mL Intravenous Continuous Imogene Burn, MD        Allergies as of 04/06/2023 - Review Complete 04/06/2023  Allergen Reaction Noted   Vicodin [hydrocodone-acetaminophen] Anaphylaxis 04/03/2014   Diazepam Other (See Comments)     Family History  Problem Relation Age of Onset    Hypertension Mother    Rheum arthritis Mother    Lung cancer Mother 45       died of other causes at 71yo   Diabetes Father    Colon polyps Sister    Breast cancer Maternal Aunt    Colon cancer Neg Hx    Rectal cancer Neg Hx    Stomach cancer Neg Hx    Ovarian cancer Neg Hx    Endometrial cancer Neg Hx    Pancreatic cancer Neg Hx    Prostate cancer Neg Hx    Sleep apnea Neg Hx    Crohn's disease Neg Hx    Esophageal cancer Neg Hx    Ulcerative colitis Neg Hx     Social History   Socioeconomic History   Marital status: Married    Spouse name: Not on file   Number of children: Not on file   Years of education: Not on file   Highest education level: Not on file  Occupational History   Occupation: retired  Tobacco Use   Smoking status: Every Day    Packs/day: 0.50    Years: 40.00    Additional pack years: 0.00    Total pack years: 20.00    Types: Cigarettes   Smokeless tobacco: Never  Vaping Use   Vaping Use: Never used  Substance and Sexual Activity   Alcohol use: Yes    Alcohol/week: 9.0 standard drinks of alcohol    Types: 3 Cans of beer, 6 Shots of liquor per week    Comment: [facility]   Drug use: No   Sexual activity: Yes  Other Topics Concern   Not on file  Social History Narrative   Not on file   Social Determinants of Health   Financial Resource Strain: Not on file  Food Insecurity: Not on file  Transportation Needs: Not on file  Physical Activity: Not on file  Stress: Not on file  Social Connections: Not on file  Intimate Partner Violence: Not on file    Physical Exam: Vital signs in last 24 hours: BP 121/72   Pulse 93   Temp 98 F (36.7 C)   Ht 5\' 2"  (1.575 m)   Wt 220 lb (99.8 kg)   SpO2 96%   BMI 40.24 kg/m  GEN: NAD EYE: Sclerae anicteric ENT: MMM CV: Non-tachycardic Pulm: No increased work of breathing GI: Soft, NT/ND NEURO:  Alert & Oriented   Eulah Pont, MD [facility]  04/06/2023 8:33 AM

## 2023-04-06 NOTE — Progress Notes (Signed)
Report to PACU, RN, vss, BBS= Clear.  

## 2023-04-06 NOTE — Patient Instructions (Signed)
Handout on hemorrhoids, polyps, diverticulosis given to patient Await pathology results Resume previous diet and continue present medications Repeat colonoscopy for surveillance will be determined based off of pathology results   YOU HAD AN ENDOSCOPIC PROCEDURE TODAY AT THE [facility]:   Refer to the procedure report that was given to you for any specific questions about what was found during the examination.  If the procedure report does not answer your questions, please call your gastroenterologist to clarify.  If you requested that your care partner not be given the details of your procedure findings, then the procedure report has been included in a sealed envelope for you to review at your convenience later.  YOU SHOULD EXPECT: Some feelings of bloating in the abdomen. Passage of more gas than usual.  Walking can help get rid of the air that was put into your GI tract during the procedure and reduce the bloating. If you had a lower endoscopy (such as a colonoscopy or flexible sigmoidoscopy) you may notice spotting of blood in your stool or on the toilet paper. If you underwent a bowel prep for your procedure, you may not have a normal bowel movement for a few days.  Please Note:  You might notice some irritation and congestion in your nose or some drainage.  This is from the oxygen used during your procedure.  There is no need for concern and it should clear up in a day or so.  SYMPTOMS TO REPORT IMMEDIATELY:  Following lower endoscopy (colonoscopy or flexible sigmoidoscopy):  Excessive amounts of blood in the stool  Significant tenderness or worsening of abdominal pains  Swelling of the abdomen that is new, acute  Fever of 100F or higher  For urgent or emergent issues, a gastroenterologist can be reached at any hour by calling [phone]. Do not use MyChart messaging for urgent concerns.    DIET:  We do recommend a small meal at first, but then you may proceed to your  regular diet.  Drink plenty of fluids but you should avoid alcoholic beverages for 24 hours.  ACTIVITY:  You should plan to take it easy for the rest of today and you should NOT DRIVE or use heavy machinery until tomorrow (because of the sedation medicines used during the test).    FOLLOW UP: Our staff will call the number listed on your records the next business day following your procedure.  We will call around 7:15- 8:00 am to check on you and address any questions or concerns that you may have regarding the information given to you following your procedure. If we do not reach you, we will leave a message.     If any biopsies were taken you will be contacted by phone or by letter within the next 1-3 weeks.  Please call us at [phone] if you have not heard about the biopsies in 3 weeks.    SIGNATURES/CONFIDENTIALITY: You and/or your care partner have signed paperwork which will be entered into your electronic medical record.  These signatures attest to the fact that that the information above on your After Visit Summary has been reviewed and is understood.  Full responsibility of the confidentiality of this discharge information lies with you and/or your care-partner.

## 2023-04-06 NOTE — Progress Notes (Signed)
Called to room to assist during endoscopic procedure.  Patient ID and intended procedure confirmed with present staff. Received instructions for my participation in the procedure from the performing physician.  

## 2023-04-06 NOTE — Op Note (Signed)
[facility] Patient Name: Veronica Warren Procedure Date: 04/06/2023 9:17 AM MRN: [id number] Endoscopist: Madelyn Brunner Graham , , [id number] Age: 72 Referring MD:  Date of Birth: [birth year] Gender: Female Account #: [id number] Procedure:                Colonoscopy Indications:              Screening for colorectal malignant neoplasm Medicines:                Monitored Anesthesia Care Procedure:                Pre-Anesthesia Assessment:                           - Prior to the procedure, a History and Physical                            was performed, and patient medications and                            allergies were reviewed. The patient's tolerance of                            previous anesthesia was also reviewed. The risks                            and benefits of the procedure and the sedation                            options and risks were discussed with the patient.                            All questions were answered, and informed consent                            was obtained. Prior Anticoagulants: The patient has                            taken no anticoagulant or antiplatelet agents. ASA                            Grade Assessment: III - A patient with severe                            systemic disease. After reviewing the risks and                            benefits, the patient was deemed in satisfactory                            condition to undergo the procedure.                           After obtaining informed consent, the colonoscope  was passed under direct vision. Throughout the                            procedure, the patient's blood pressure, pulse, and                            oxygen saturations were monitored continuously. The                            CF [id number] #[id number] was introduced through the anus                            and advanced to the the terminal ileum. The                             colonoscopy was performed without difficulty. The                            patient tolerated the procedure well. The quality                            of the bowel preparation was good. The terminal                            ileum, ileocecal valve, appendiceal orifice, and                            rectum were photographed. Scope In: 9:22:35 AM Scope Out: 9:38:58 AM Scope Withdrawal Time: 0 hours 13 minutes 11 seconds  Total Procedure Duration: 0 hours 16 minutes 23 seconds  Findings:                 The terminal ileum appeared normal.                           Five sessile polyps were found in the descending                            colon, transverse colon and cecum. The polyps were                            3 to 6 mm in size. These polyps were removed with a                            cold snare. Resection and retrieval were complete.                           Multiple diverticula were found in the sigmoid                            colon.                           Non-bleeding internal hemorrhoids were found during  retroflexion. Complications:            No immediate complications. Estimated Blood Loss:     Estimated blood loss was minimal. Impression:               - The examined portion of the ileum was normal.                           - Five 3 to 6 mm polyps in the descending colon, in                            the transverse colon and in the cecum, removed with                            a cold snare. Resected and retrieved.                           - Diverticulosis in the sigmoid colon.                           - Non-bleeding internal hemorrhoids. Recommendation:           - Discharge patient to home (with escort).                           - Await pathology results.                           - The findings and recommendations were discussed                            with the patient. Dr Particia Lather "Alan Ripper" Leonides Schanz,  04/06/2023  9:43:05 AM

## 2023-04-07 ENCOUNTER — Telehealth: Payer: Self-pay

## 2023-04-07 NOTE — Telephone Encounter (Signed)
Post procedure follow up call, no answer 

## 2023-04-14 ENCOUNTER — Encounter: Payer: Self-pay | Admitting: Internal Medicine

## 2023-06-08 ENCOUNTER — Encounter: Payer: Self-pay | Admitting: Neurology

## 2023-06-08 ENCOUNTER — Ambulatory Visit (INDEPENDENT_AMBULATORY_CARE_PROVIDER_SITE_OTHER): Payer: Managed Care, Other (non HMO) | Admitting: Neurology

## 2023-06-08 VITALS — BP 116/72 | HR 80 | Ht 62.0 in | Wt 216.0 lb

## 2023-06-08 DIAGNOSIS — Z789 Other specified health status: Secondary | ICD-10-CM

## 2023-06-08 DIAGNOSIS — G4733 Obstructive sleep apnea (adult) (pediatric): Secondary | ICD-10-CM

## 2023-06-08 NOTE — Progress Notes (Signed)
Subjective:    Patient ID: Veronica Warren is a 72 y.o. female.  HPI    Interim history:   Veronica Warren is a 72 year old female with an underlying medical history of reflux disease, hyperlipidemia, osteopenia, prediabetes, anxiety, rheumatoid arthritis, s/p right total hip replacement, depression, chronic kidney disease, smoking, and obesity, who presents for follow-up consultation for obstructive sleep apnea after interim testing and starting home AutoPap therapy.  The patient is unaccompanied today.  I first met her at the request of her primary care provider on 09/02/2022, at which time she reported snoring and excessive daytime somnolence.  She was advised to proceed with a sleep study.  She had a home sleep test on 03/03/2023 which showed severe obstructive sleep apnea with a total AHI of 38.6/hour and O2 nadir of 72% with significant time below or at 88% saturation of over 25 minutes, indicating nocturnal hypoxemia.  Snoring was detected, in the mild to moderate range.  She was advised to proceed with home AutoPap therapy.  Her set up date was 03/25/2023.  She has a ResMed air sense 10 AutoSet machine.  Her DME provider is [facility].  Today, 06/08/2023: I reviewed her AutoPap compliance data from 05/08/2023 through 06/06/2023, which is a total of 30 days, during which time she used her machine every night with present use days greater than 4 hours at 100%, indicating superb compliance with an average usage of 7 hours and 37 minutes, residual AHI: 1.9/h, average pressure for the 95th percentile at 11.2 cm with a range of 6 to 13 cm with EPR of 3.  Leak acceptable with a 95th percentile at 10 L/min.  She reports using her machine consistently, she is not necessarily home a lot better when it comes to daytime energy, her Epworth sleepiness score is 5 out of 24 which has maintained.  She is motivated to continue with treatment and is working on weight loss as well.  She is using nasal pillows with good  tolerance, denies any significant mouth dryness, does use the humidifier and has had replacement of supplies, has changed the filter on a regular basis already.  1 nostril tends to be more congested at night than the other, she would be interested in exploring a different nasal mask.  She still smokes, less than a pack per day, she would like to quit smoking.  The patient's allergies, current medications, family history, past medical history, past social history, past surgical history and problem list were reviewed and updated as appropriate.   Previously:   09/02/22: (She) reports snoring and sleep disruption. I reviewed your office note from 07/31/2022.  Her Epworth sleepiness score is 5 out of 24, fatigue severity score is 14 out of 63.  She has nocturia about twice per average night, denies recurrent nocturnal or morning headaches, is not aware of any family history of sleep apnea but her mother snored.  Her husband does not sleep in the same bedroom with her due to her loud snoring.  She is working on weight loss but is currently not working actively on smoking cessation.  She smokes a little less than a pack per day.  She is retired, she worked in an Agricultural consultant.  Bedtime is generally between midnight and 1 AM, rise time around 8 AM.  She drinks caffeine in the form of diet soda, 2 20 ounce bottles per day.  She lives with her husband, she has 2 grown children, a 37-year-old granddaughter and a 47 year old step  grandson.  They have no pets in the household.  She does not have a TV in her bedroom.  She sees rheumatology and her orthopedic surgeon is Veronica Warren.     Her Past Medical History Is Significant For: Past Medical History:  Diagnosis Date   Anxiety    Arthritis    RHEUMATOID   Complication of anesthesia    woke up fighting after valium taken   GERD (gastroesophageal reflux disease)    History of Chronic kidney disease    STAGE III KIDNEY DISEASE dx 16 yrs qgo no problem with   kidney labs since then   Hyperlipidemia    Osteopenia    Pre-diabetes    Sleep apnea    Wears glasses     Her Past Surgical History Is Significant For: Past Surgical History:  Procedure Laterality Date   benign tumor removed from left leg     age 84   BREAST ENHANCEMENT SURGERY Bilateral    age 20's   breast replacements removed     30 yrs ago   CESAREAN SECTION     2 times   COLONOSCOPY     LIPOSUCTION     yrs ago   SQUAMOUS CELL CARCINOMA EXCISION  2020   right forearm   tight thr  07/2007   TOTAL HIP REVISION Right 04/16/2014   Procedure: TOTAL HIP REVISION ARTHOPLASTY;  Surgeon: Veronica Pal, MD;  Location: [facility];  Service: Orthopedics;  Laterality: Right;   TUBAL LIGATION     at second c-section   tummy tuck     yrs ago   WISDOM TOOTH EXTRACTION     as teenager    Her Family History Is Significant For: Family History  Problem Relation Age of Onset   Hypertension Mother    Rheum arthritis Mother    Lung cancer Mother 23       died of other causes at 22yo   Diabetes Father    Colon polyps Sister    Breast cancer Maternal Aunt    Colon cancer Neg Hx    Rectal cancer Neg Hx    Stomach cancer Neg Hx    Ovarian cancer Neg Hx    Endometrial cancer Neg Hx    Pancreatic cancer Neg Hx    Prostate cancer Neg Hx    Sleep apnea Neg Hx    Crohn's disease Neg Hx    Esophageal cancer Neg Hx    Ulcerative colitis Neg Hx     Her Social History Is Significant For: Social History   Socioeconomic History   Marital status: Married    Spouse name: Not on file   Number of children: Not on file   Years of education: Not on file   Highest education level: Not on file  Occupational History   Occupation: retired  Tobacco Use   Smoking status: Every Day    Current packs/day: 0.50    Average packs/day: 0.5 packs/day for 40.0 years (20.0 ttl pk-yrs)    Types: Cigarettes   Smokeless tobacco: Never  Vaping Use   Vaping status: Never Used  Substance and Sexual  Activity   Alcohol use: Yes    Alcohol/week: 9.0 standard drinks of alcohol    Types: 3 Cans of beer, 6 Shots of liquor per week    Comment: OCC   Drug use: No   Sexual activity: Yes  Other Topics Concern   Not on file  Social History Narrative   Not on file  Social Determinants of Health   Financial Resource Strain: Low Risk  (01/12/2023)   Received from [facility], [facility]   Overall Financial Resource Strain (CARDIA)    Difficulty of Paying Living Expenses: Not hard at all  Food Insecurity: No Food Insecurity (01/12/2023)   Received from [facility], [facility]   Hunger Vital Sign    Worried About Running Out of Food in the Last Year: Never true    Ran Out of Food in the Last Year: Never true  Transportation Needs: No Transportation Needs (01/12/2023)   Received from [facility], [facility]   PRAPARE - Transportation    Lack of Transportation (Medical): No    Lack of Transportation (Non-Medical): No  Physical Activity: Unknown (01/12/2023)   Received from [facility], [facility]   Exercise Vital Sign    Days of Exercise per Week: 0 days    Minutes of Exercise per Session: Not on file  Stress: Stress Concern Present (01/12/2023)   Received from [facility], [facility] - Occupational Stress Questionnaire    Feeling of Stress : To some extent  Social Connections: Socially Isolated (01/12/2023)   Received from [facility], [facility]   Social Network    How would you rate your social network (family, work, friends)?: Little participation, lonely and socially isolated    Her Allergies Are:  Allergies  Allergen Reactions   Vicodin [Hydrocodone-Acetaminophen] Anaphylaxis   Diazepam Other (See Comments)    Pt wake up fighting   :   Her Current Medications Are:  Outpatient Encounter Medications as of 06/08/2023  Medication Sig   buPROPion (WELLBUTRIN XL) 150 MG 24 hr tablet Take 1 tablet by mouth  every morning.   omeprazole (PRILOSEC) 20 MG capsule Take 20 mg by mouth every other day.   Semaglutide-Weight Management 0.25 MG/0.5ML SOAJ Inject 0.5 mLs into the skin once a week.   simvastatin (ZOCOR) 40 MG tablet 1 tablet in the evening Orally Once a day   XELJANZ XR 11 MG TB24 Take 1 tablet by mouth daily.   [DISCONTINUED] cyclobenzaprine (FLEXERIL) 5 MG tablet Take 5 mg by mouth 3 (three) times daily as needed for muscle spasms.   [DISCONTINUED] naproxen (NAPROSYN) 500 MG tablet Take 500 mg by mouth 2 (two) times daily with a meal.   No facility-administered encounter medications on file as of 06/08/2023.  :  Review of Systems:  Out of a complete 14 point review of systems, all are reviewed and negative with the exception of these symptoms as listed below:  Review of Systems  Neurological:        Rm 9 alone Pt is well, reports she is having some trouble with adjusting to CPAP. She also doesn't feel a difference with using machine.     Objective:  Neurological Exam  Physical Exam Physical Examination:   Vitals:   06/08/23 1304  BP: 116/72  Pulse: 80    General Examination: The patient is a very pleasant 72 y.o. female in no acute distress. She appears well-developed and well-nourished and well groomed.   HEENT: Normocephalic, atraumatic, pupils are equal, round and reactive to light, extraocular tracking is good without limitation to gaze excursion or nystagmus noted. Hearing is grossly intact. Face is symmetric with normal facial animation. Speech is clear with no dysarthria noted. There is no hypophonia. There is no lip, neck/head, jaw or voice tremor. Neck is supple with full range of passive and active  motion. There are no carotid bruits on auscultation. Oropharynx exam reveals: Mild mouth dryness, tongue protrudes centrally and palate elevates symmetrically, no nasal irritation, mildly deviated septum noted to the right.  Mildly asymmetrical nostrils.   Chest: Clear to  auscultation without wheezing, rhonchi or crackles noted.   Heart: S1+S2+0, regular and normal without murmurs, rubs or gallops noted.    Abdomen: Soft, non-tender and non-distended.   Extremities: There is no pitting edema in the distal lower extremities bilaterally.    Skin: Warm and dry without trophic changes noted.    Musculoskeletal: exam reveals no obvious joint deformities.    Neurologically:  Mental status: The patient is awake, alert and oriented in all 4 spheres. Her immediate and remote memory, attention, language skills and fund of knowledge are appropriate. There is no evidence of aphasia, agnosia, apraxia or anomia. Speech is clear with normal prosody and enunciation. Thought process is linear. Mood is normal and affect is normal.  Cranial nerves II - XII are as described above under HEENT exam.  Motor exam: Normal bulk, strength and tone is noted. There is no obvious action or resting tremor.  Fine motor skills and coordination: grossly intact.  Cerebellar testing: No dysmetria or intention tremor. There is no truncal or gait ataxia.  Sensory exam: intact to light touch in the upper and lower extremities.  Gait, station and balance: She stands easily. No veering to one side is noted. No leaning to one side is noted. Posture is age-appropriate and stance is narrow based. Gait shows normal stride length and normal pace. No problems turning are noted.    Assessment and Plan:  In summary, JADYN BRASHER is a 72 year old female with an underlying medical history of reflux disease, hyperlipidemia, osteopenia, prediabetes, anxiety, rheumatoid arthritis, s/p right total hip replacement, depression, chronic kidney disease, smoking, and obesity, who presents for follow-up consultation for obstructive sleep apnea after interim testing and starting home AutoPap therapy. She had a home sleep test on 03/03/2023 which showed severe obstructive sleep apnea with a total AHI of 38.6/hour and O2  nadir of 72% with significant time below or at 88% saturation of over 25 minutes, indicating nocturnal hypoxemia.  Snoring was in the mild to moderate range.  She has been on home AutoPap therapy since 03/25/2023.  She has a ResMed air sense 10 AutoSet machine.  Her DME provider is [facility].  She uses nasal pillows.  She has adjusted quite well to treatment, she is motivated to continue with treatment and is also working on weight loss.  She is highly commended for treatment adherence, we explored other nasal mask options and I was able to provide her with a sample for the [facility] nasal cushion interface, size small.  She is encouraged to try it and reorder it through her DME if she likes it.  She is advised to stay up-to-date with her supplies and continue using her AutoPap at the current settings, apnea control is very good, leak from the mask is acceptable.  She is encouraged to continue to work on weight loss and smoking cessation.  She is advised to follow-up routinely in this clinic to see one of our nurse practitioners in 1 year, sooner if needed.  We reviewed her home sleep test results in detail as well as her compliance data.  I answered all her questions today and he was in agreement with our plan. I spent 30 minutes in total face-to-face time and in reviewing records during  pre-charting, more than 50% of which was spent in counseling and coordination of care, reviewing test results, reviewing medications and treatment regimen and/or in discussing or reviewing the diagnosis of OSA, the prognosis and treatment options. Pertinent laboratory and imaging test results that were available during this visit with the patient were reviewed by me and considered in my medical decision making (see chart for details).   whose history and physical exam are concerning for sleep disordered breathing, supporting a current working diagnosis of unspecified sleep apnea, with the main differential diagnoses  of obstructive sleep apnea (OSA) versus upper airway resistance syndrome (UARS) versus central sleep apnea (CSA), or mixed sleep apnea. A laboratory attended sleep study is considered gold standard for evaluation of sleep disordered breathing and is recommended at this time and clinically justified.   I had a long chat with the patient about my findings and the diagnosis of sleep apnea, particularly OSA, its prognosis and treatment options. We talked about medical/conservative treatments, surgical interventions and non-pharmacological approaches for symptom control. I explained, in particular, the risks and ramifications of untreated moderate to severe OSA, especially with respect to developing cardiovascular disease down the road, including congestive heart failure (CHF), difficult to treat hypertension, cardiac arrhythmias (particularly A-fib), neurovascular complications including TIA, stroke and dementia. Even type 2 diabetes has, in part, been linked to untreated OSA. Symptoms of untreated OSA may include (but may not be limited to) daytime sleepiness, nocturia (i.e. frequent nighttime urination), memory problems, mood irritability and suboptimally controlled or worsening mood disorder such as depression and/or anxiety, lack of energy, lack of motivation, physical discomfort, as well as recurrent headaches, especially morning or nocturnal headaches. We talked about the importance of maintaining a healthy lifestyle and striving for healthy weight.  The importance of complete smoking cessation was also addressed.  In addition, we talked about the importance of striving for and maintaining good sleep hygiene. I recommended the following at this time: sleep study.  I outlined the differences between a laboratory attended sleep study which is considered more comprehensive and accurate over the option of a home sleep test (HST); the latter may lead to underestimation of sleep disordered breathing in some instances  and does not help with diagnosing upper airway resistance syndrome and is not accurate enough to diagnose primary central sleep apnea typically. I explained the different sleep test procedures to the patient in detail and also outlined possible surgical and non-surgical treatment options of OSA, including the use of a pressure airway pressure (PAP) device (ie CPAP, AutoPAP/APAP or BiPAP in certain circumstances), a custom-made dental device (aka oral appliance, which would require a referral to a specialist dentist or orthodontist typically, and is generally speaking not considered a good choice for patients with full dentures or edentulous state), upper airway surgical options, such as traditional UPPP (which is not considered a first-line treatment) or the Inspire device (hypoglossal nerve stimulator, which would involve a referral for consultation with an ENT surgeon, after careful selection, following inclusion criteria). I explained the PAP treatment option to the patient in detail, as this is generally considered first-line treatment.  The patient indicated that she would be willing to try PAP therapy, if the need arises. I explained the importance of being compliant with PAP treatment, not only for insurance purposes but primarily to improve patient's symptoms symptoms, and for the patient's long term health benefit, including to reduce Her cardiovascular risks longer-term.

## 2023-06-08 NOTE — Patient Instructions (Addendum)
It was nice to see you again today.  I am glad to see that you are able to use your autoPap machine, your compliance is excellent.  Your apnea is under good control as far as your numbers look.  Your average usage is great.  You have fulfilled insurance required compliance percentages as well. Please talk to your DME provider about getting replacement supplies on a regular basis. Please be sure to change your filter every month, your mask about every 3 months, hose about every 6 months, humidifier chamber about yearly. Some restrictions are imposed by your insurance carrier with regard to how frequently you can get certain supplies.  Your DME company can provide further details if necessary. Please try the nasal cushion mask sample that are provided today.  You can reorder it through your DME if you like it. Please continue using your autoPAP regularly. While your insurance requires that you use PAP at least 4 hours each night on 70% of the nights, I recommend, that you not skip any nights and use it throughout the night if you can. Getting used to PAP and staying with the treatment long term does take time and patience and discipline. Untreated obstructive sleep apnea when it is moderate to severe can have an adverse impact on cardiovascular health and raise her risk for heart disease, arrhythmias, hypertension, congestive heart failure, stroke and diabetes. Untreated obstructive sleep apnea causes sleep disruption, nonrestorative sleep, and sleep deprivation. This can have an impact on your day to day functioning and cause daytime sleepiness and impairment of cognitive function, memory loss, mood disturbance, and problems focussing. Using PAP regularly can improve these symptoms.  We can see you in 1 year, you can see one of our nurse practitioners as you are stable.

## 2024-06-08 ENCOUNTER — Ambulatory Visit: Payer: Medicare Other | Admitting: Adult Health

## 2024-07-19 ENCOUNTER — Encounter: Payer: Self-pay | Admitting: Adult Health

## 2024-07-19 ENCOUNTER — Ambulatory Visit: Admitting: Adult Health

## 2024-07-19 ENCOUNTER — Ambulatory Visit (INDEPENDENT_AMBULATORY_CARE_PROVIDER_SITE_OTHER): Admitting: Adult Health

## 2024-07-19 VITALS — BP 125/75 | HR 78 | Ht 62.0 in | Wt 194.0 lb

## 2024-07-19 DIAGNOSIS — G4733 Obstructive sleep apnea (adult) (pediatric): Secondary | ICD-10-CM

## 2024-07-19 NOTE — Progress Notes (Signed)
 PATIENT: Veronica Warren DOB: [birth year]  REASON FOR VISIT: follow up HISTORY FROM: patient PRIMARY NEUROLOGIST: Dr. Buck  Chief Complaint  Patient presents with   RM 4    Patient is here alone for cpap follow-up. ESS 5.     HISTORY OF PRESENT ILLNESS: Today 07/19/24:  Veronica Warren is a 73 y.o. female with a history of obstructive sleep apnea on CPAP. Returns today for follow-up.  Reports that CPAP is working well.  She does state that she would like to try the nasal mask.  Currently wearing the nasal pillows.  She does find it beneficial and can tell a difference when she uses it.  She states that with a different mask she feels that she can use it more often.         REVIEW OF SYSTEMS: Out of a complete 14 system review of symptoms, the patient complains only of the following symptoms, and all other reviewed systems are negative.   ESS 5  ALLERGIES: Allergies  Allergen Reactions   Vicodin [Hydrocodone-Acetaminophen] Anaphylaxis   Diazepam Other (See Comments)    Pt wake up fighting     HOME MEDICATIONS: Outpatient Medications Prior to Visit  Medication Sig Dispense Refill   buPROPion (WELLBUTRIN XL) 150 MG 24 hr tablet Take 1 tablet by mouth every morning.     omeprazole (PRILOSEC) 20 MG capsule Take 20 mg by mouth every other day.     Semaglutide-Weight Management 0.25 MG/0.5ML SOAJ Inject 0.5 mLs into the skin once a week.     simvastatin (ZOCOR) 40 MG tablet 1 tablet in the evening Orally Once a day     XELJANZ XR 11 MG TB24 Take 1 tablet by mouth daily.     No facility-administered medications prior to visit.    PAST MEDICAL HISTORY: Past Medical History:  Diagnosis Date   Anxiety    Arthritis    RHEUMATOID   Complication of anesthesia    woke up fighting after valium taken   GERD (gastroesophageal reflux disease)    History of Chronic kidney disease    STAGE III KIDNEY DISEASE dx 15 yrs qgo no problem with  kidney labs since then    Hyperlipidemia    Osteopenia    Pre-diabetes    Sleep apnea    Wears glasses     PAST SURGICAL HISTORY: Past Surgical History:  Procedure Laterality Date   benign tumor removed from left leg     age 6   BREAST ENHANCEMENT SURGERY Bilateral    age 89's   breast replacements removed     31 yrs ago   CESAREAN SECTION     2 times   COLONOSCOPY     LIPOSUCTION     yrs ago   SQUAMOUS CELL CARCINOMA EXCISION  2020   right forearm   tight thr  07/2007   TOTAL HIP REVISION Right 04/16/2014   Procedure: TOTAL HIP REVISION ARTHOPLASTY;  Surgeon: Donnice JONETTA Car, MD;  Location: [facility];  Service: Orthopedics;  Laterality: Right;   TUBAL LIGATION     at second c-section   tummy tuck     yrs ago   WISDOM TOOTH EXTRACTION     as teenager    FAMILY HISTORY: Family History  Problem Relation Age of Onset   Hypertension Mother    Rheum arthritis Mother    Lung cancer Mother 51       died of other causes at 25yo   Diabetes  Father    Colon polyps Sister    Breast cancer Maternal Aunt    Colon cancer Neg Hx    Rectal cancer Neg Hx    Stomach cancer Neg Hx    Ovarian cancer Neg Hx    Endometrial cancer Neg Hx    Pancreatic cancer Neg Hx    Prostate cancer Neg Hx    Sleep apnea Neg Hx    Crohn's disease Neg Hx    Esophageal cancer Neg Hx    Ulcerative colitis Neg Hx     SOCIAL HISTORY: Social History   Socioeconomic History   Marital status: Married    Spouse name: Not on file   Number of children: Not on file   Years of education: Not on file   Highest education level: Not on file  Occupational History   Occupation: retired  Tobacco Use   Smoking status: Every Day    Current packs/day: 0.50    Average packs/day: 0.5 packs/day for 40.0 years (20.0 ttl pk-yrs)    Types: Cigarettes   Smokeless tobacco: Never  Vaping Use   Vaping status: Never Used  Substance and Sexual Activity   Alcohol use: Yes    Alcohol/week: 9.0 standard drinks of alcohol    Types: 3 Cans of  beer, 6 Shots of liquor per week    Comment: [facility]   Drug use: No   Sexual activity: Yes  Other Topics Concern   Not on file  Social History Narrative   Right handed   Caffeine: 5 cups/day mostly diet coke   Lives with husband    Social Drivers of Corporate investment banker Strain: Low Risk  (11/20/2023)   Received from [facility]   Overall Financial Resource Strain (CARDIA)    Difficulty of Paying Living Expenses: Not hard at all  Food Insecurity: No Food Insecurity (11/20/2023)   Received from [facility]   Hunger Vital Sign    Within the past 12 months, you worried that your food would run out before you got the money to buy more.: Never true    Within the past 12 months, the food you bought just didn't last and you didn't have money to get more.: Never true  Transportation Needs: No Transportation Needs (11/20/2023)   Received from [facility] - Transportation    Lack of Transportation (Medical): No    Lack of Transportation (Non-Medical): No  Physical Activity: Unknown (11/20/2023)   Received from [facility]   Exercise Vital Sign    On average, how many days per week do you engage in moderate to strenuous exercise (like a brisk walk)?: Patient declined    Minutes of Exercise per Session: Not on file  Stress: Stress Concern Present (11/20/2023)   Received from [facility] - Occupational Stress Questionnaire    Feeling of Stress : Rather much  Social Connections: Somewhat Isolated (11/20/2023)   Received from [facility]   Social Network    How would you rate your social network (family, work, friends)?: Restricted participation with some degree of social isolation  Intimate Partner Violence: Not At Risk (11/20/2023)   Received from [facility]   HITS    Over the last 12 months how often did your partner physically hurt you?: Never    Over the last 12 months how often did your partner insult you or talk down to you?:  Never    Over the last 12  months how often did your partner threaten you with physical harm?: Never    Over the last 12 months how often did your partner scream or curse at you?: Never      PHYSICAL EXAM  Vitals:   07/19/24 1128  BP: 125/75  Pulse: 78  Weight: 194 lb (88 kg)  Height: 5' 2 (1.575 m)   Body mass index is 35.48 kg/m.  Generalized: Well developed, in no acute distress  Chest: Lungs clear to auscultation bilaterally  Neurological examination  Mentation: Alert oriented to time, place, history taking. Follows all commands speech and language fluent Cranial nerve II-XII: Facial symmetry noted  DIAGNOSTIC DATA (LABS, IMAGING, TESTING) - I reviewed patient records, labs, notes, testing and imaging myself where available.  Lab Results  Component Value Date   WBC 18.2 (H) 04/18/2014   HGB 11.3 (L) 04/18/2014   HCT 34.7 (L) 04/18/2014   MCV 87.8 04/18/2014   PLT 184 04/18/2014      Component Value Date/Time   NA 141 04/18/2014 0425   K 4.4 04/18/2014 0425   CL 105 04/18/2014 0425   CO2 27 04/18/2014 0425   GLUCOSE 139 (H) 04/18/2014 0425   BUN 18 04/18/2014 0425   CREATININE 0.95 04/18/2014 0425   CALCIUM 9.1 04/18/2014 0425   PROT 6.3 05/08/2008 0953   ALBUMIN 3.5 05/08/2008 0953   AST 32 05/08/2008 0953   ALT 40 (H) 05/08/2008 0953   ALKPHOS 46 05/08/2008 0953   BILITOT 0.6 05/08/2008 0953   GFRNONAA 63 (L) 04/18/2014 0425   GFRAA 73 (L) 04/18/2014 0425   Lab Results  Component Value Date   CHOL 160 05/08/2008   HDL 29.6 (L) 05/08/2008   LDLCALC 105 (H) 05/08/2008   TRIG 129 05/08/2008   CHOLHDL 5.4 CALC 05/08/2008    ASSESSMENT AND PLAN 73 y.o. year old female  has a past medical history of Anxiety, Arthritis, Complication of anesthesia, GERD (gastroesophageal reflux disease), History of Chronic kidney disease, Hyperlipidemia, Osteopenia, Pre-diabetes, Sleep apnea, and Wears glasses. here with:  OSA on CPAP  - CPAP compliance suboptimal -  Good treatment of AHI  -Mask refitting ordered - Encourage patient to use CPAP nightly and > 4 hours each night - F/U in 1 year or sooner if needed   Orders Placed This Encounter  Procedures   For home use only DME continuous positive airway pressure (CPAP)    Mask refitting, try nasal mask    Length of Need:   12 Months    Patient has OSA or probable OSA:   Yes    Is the patient currently using CPAP in the home:   Yes    Settings:   Other see comments    CPAP supplies needed:   Mask, headgear, cushions, filters, heated tubing and water chamber      Duwaine Russell, MSN, NP-C 07/19/2024, 11:54 AM [facility] [address] [place], [place] [address] [phone]  The patient's condition requires frequent monitoring and adjustments in the treatment plan, reflecting the ongoing complexity of care.  This provider is the continuing focal point for all needed services for this condition.

## 2024-07-19 NOTE — Patient Instructions (Addendum)
 Continue using CPAP nightly and greater than 4 hours each night If your symptoms worsen or you develop new symptoms please let us  know.

## 2024-07-20 NOTE — Progress Notes (Signed)
 RE: mask refitting, try nasal mask Received: Today New, Adine Neysa Nena GORMAN, RN; Joylene Carlean Sheree Leveda Jackson Easter Delsa Eleanor; 1 other Received, thank you!     Previous Messages    ----- Message ----- From: Neysa Nena GORMAN, RN Sent: 07/19/2024   2:17 PM EDT To: Adine Joylene; Avelina Jackson; Ephraim Dollar* Subject: mask refitting, try nasal mask                Good afternoon,  Need mask refitting and try nasal mask  Veronica Warren Female, 73 y.o., 1951/05/11 MRN: [id number]   Thanks,  Veronica Warren

## 2025-07-24 ENCOUNTER — Telehealth: Admitting: Adult Health
